# Patient Record
Sex: Female | Born: 1955 | Race: White | Hispanic: No | Smoking: Current every day smoker
Health system: Southern US, Community
[De-identification: ages and names within clinical notes are randomized; demographics above are authoritative.]

## PROBLEM LIST (undated history)

## (undated) DIAGNOSIS — I517 Cardiomegaly: Secondary | ICD-10-CM

## (undated) DIAGNOSIS — I10 Essential (primary) hypertension: Secondary | ICD-10-CM

## (undated) DIAGNOSIS — I340 Nonrheumatic mitral (valve) insufficiency: Secondary | ICD-10-CM

## (undated) DIAGNOSIS — E669 Obesity, unspecified: Secondary | ICD-10-CM

## (undated) DIAGNOSIS — F419 Anxiety disorder, unspecified: Secondary | ICD-10-CM

## (undated) DIAGNOSIS — F17200 Nicotine dependence, unspecified, uncomplicated: Secondary | ICD-10-CM

## (undated) DIAGNOSIS — J45909 Unspecified asthma, uncomplicated: Secondary | ICD-10-CM

## (undated) DIAGNOSIS — M545 Low back pain, unspecified: Secondary | ICD-10-CM

## (undated) DIAGNOSIS — K219 Gastro-esophageal reflux disease without esophagitis: Secondary | ICD-10-CM

## (undated) DIAGNOSIS — J449 Chronic obstructive pulmonary disease, unspecified: Secondary | ICD-10-CM

## (undated) HISTORY — PX: TONSILLECTOMY: SUR1361

## (undated) HISTORY — PX: OTHER SURGICAL HISTORY: SHX169

---

## 2011-04-26 ENCOUNTER — Emergency Department: Payer: Self-pay | Admitting: Emergency Medicine

## 2012-05-29 ENCOUNTER — Emergency Department: Payer: Self-pay | Admitting: Emergency Medicine

## 2012-05-29 LAB — URINALYSIS, COMPLETE
Blood: NEGATIVE
Leukocyte Esterase: NEGATIVE
Nitrite: NEGATIVE
Ph: 6 (ref 4.5–8.0)
Protein: NEGATIVE
RBC,UR: 2 /HPF (ref 0–5)
Squamous Epithelial: <1

## 2013-04-15 ENCOUNTER — Emergency Department: Payer: Self-pay | Admitting: Emergency Medicine

## 2013-05-22 ENCOUNTER — Emergency Department: Payer: Self-pay | Admitting: Emergency Medicine

## 2013-05-22 LAB — BASIC METABOLIC PANEL
Anion Gap: 4 — ABNORMAL LOW (ref 7–16)
BUN: 19 mg/dL — ABNORMAL HIGH (ref 7–18)
CO2: 27 mmol/L (ref 21–32)
CREATININE: 1.29 mg/dL (ref 0.60–1.30)
Calcium, Total: 8.8 mg/dL (ref 8.5–10.1)
Chloride: 107 mmol/L (ref 98–107)
EGFR (African American): 54 — ABNORMAL LOW
GFR CALC NON AF AMER: 46 — AB
Glucose: 103 mg/dL — ABNORMAL HIGH (ref 65–99)
Osmolality: 278 (ref 275–301)
POTASSIUM: 4.4 mmol/L (ref 3.5–5.1)
SODIUM: 138 mmol/L (ref 136–145)

## 2013-05-22 LAB — CBC
HCT: 42.6 % (ref 35.0–47.0)
HGB: 13.8 g/dL (ref 12.0–16.0)
MCH: 30.2 pg (ref 26.0–34.0)
MCHC: 32.4 g/dL (ref 32.0–36.0)
MCV: 93 fL (ref 80–100)
PLATELETS: 194 10*3/uL (ref 150–440)
RBC: 4.55 10*6/uL (ref 3.80–5.20)
RDW: 13.5 % (ref 11.5–14.5)
WBC: 8.7 10*3/uL (ref 3.6–11.0)

## 2013-05-22 LAB — TROPONIN I: Troponin-I: <0.02 ng/mL

## 2013-05-23 LAB — TROPONIN I: Troponin-I: <0.02 ng/mL

## 2013-08-03 ENCOUNTER — Emergency Department: Payer: Self-pay | Admitting: Emergency Medicine

## 2013-08-03 LAB — CBC WITH DIFFERENTIAL/PLATELET
Basophil #: 0.1 10*3/uL (ref 0.0–0.1)
Basophil %: 1.1 %
EOS ABS: 0.3 10*3/uL (ref 0.0–0.7)
Eosinophil %: 4.6 %
HCT: 40.5 % (ref 35.0–47.0)
HGB: 13.5 g/dL (ref 12.0–16.0)
LYMPHS PCT: 23.7 %
Lymphocyte #: 1.5 10*3/uL (ref 1.0–3.6)
MCH: 31.6 pg (ref 26.0–34.0)
MCHC: 33.5 g/dL (ref 32.0–36.0)
MCV: 94 fL (ref 80–100)
Monocyte #: 0.5 x10 3/mm (ref 0.2–0.9)
Monocyte %: 7.8 %
Neutrophil #: 3.9 10*3/uL (ref 1.4–6.5)
Neutrophil %: 62.8 %
Platelet: 199 10*3/uL (ref 150–440)
RBC: 4.29 10*6/uL (ref 3.80–5.20)
RDW: 13.7 % (ref 11.5–14.5)
WBC: 6.2 10*3/uL (ref 3.6–11.0)

## 2013-08-03 LAB — COMPREHENSIVE METABOLIC PANEL
ANION GAP: 5 — AB (ref 7–16)
AST: 35 U/L (ref 15–37)
Albumin: 3.4 g/dL (ref 3.4–5.0)
Alkaline Phosphatase: 94 U/L
BUN: 25 mg/dL — ABNORMAL HIGH (ref 7–18)
Bilirubin,Total: 0.2 mg/dL (ref 0.2–1.0)
CALCIUM: 9.6 mg/dL (ref 8.5–10.1)
CO2: 32 mmol/L (ref 21–32)
CREATININE: 1.2 mg/dL (ref 0.60–1.30)
Chloride: 100 mmol/L (ref 98–107)
EGFR (African American): 59 — ABNORMAL LOW
GFR CALC NON AF AMER: 50 — AB
GLUCOSE: 118 mg/dL — AB (ref 65–99)
Osmolality: 279 (ref 275–301)
POTASSIUM: 3.7 mmol/L (ref 3.5–5.1)
SGPT (ALT): 34 U/L (ref 12–78)
Sodium: 137 mmol/L (ref 136–145)
Total Protein: 7.4 g/dL (ref 6.4–8.2)

## 2013-08-03 LAB — LIPASE, BLOOD: Lipase: 144 U/L (ref 73–393)

## 2013-10-15 ENCOUNTER — Emergency Department: Payer: Self-pay | Admitting: Emergency Medicine

## 2013-10-15 LAB — URINALYSIS, COMPLETE
BILIRUBIN, UR: NEGATIVE
BLOOD: NEGATIVE
Bacteria: NONE SEEN
Glucose,UR: NEGATIVE mg/dL (ref 0–75)
KETONE: NEGATIVE
Leukocyte Esterase: NEGATIVE
NITRITE: NEGATIVE
PH: 6 (ref 4.5–8.0)
Protein: NEGATIVE
RBC,UR: NONE SEEN /HPF (ref 0–5)
SPECIFIC GRAVITY: 1.015 (ref 1.003–1.030)
Squamous Epithelial: 1
WBC UR: NONE SEEN /HPF (ref 0–5)

## 2013-10-15 LAB — BASIC METABOLIC PANEL
ANION GAP: 7 (ref 7–16)
BUN: 10 mg/dL (ref 7–18)
CALCIUM: 9.3 mg/dL (ref 8.5–10.1)
CHLORIDE: 107 mmol/L (ref 98–107)
CREATININE: 1.23 mg/dL (ref 0.60–1.30)
Co2: 28 mmol/L (ref 21–32)
EGFR (African American): 57 — ABNORMAL LOW
EGFR (Non-African Amer.): 49 — ABNORMAL LOW
Glucose: 119 mg/dL — ABNORMAL HIGH (ref 65–99)
OSMOLALITY: 283 (ref 275–301)
POTASSIUM: 3.4 mmol/L — AB (ref 3.5–5.1)
Sodium: 142 mmol/L (ref 136–145)

## 2013-10-15 LAB — DRUG SCREEN, URINE
Amphetamines, Ur Screen: NEGATIVE (ref ?–1000)
Barbiturates, Ur Screen: NEGATIVE (ref ?–200)
Benzodiazepine, Ur Scrn: POSITIVE (ref ?–200)
Cannabinoid 50 Ng, Ur ~~LOC~~: NEGATIVE (ref ?–50)
Cocaine Metabolite,Ur ~~LOC~~: NEGATIVE (ref ?–300)
MDMA (Ecstasy)Ur Screen: NEGATIVE (ref ?–500)
Methadone, Ur Screen: NEGATIVE (ref ?–300)
Opiate, Ur Screen: POSITIVE (ref ?–300)
Phencyclidine (PCP) Ur S: NEGATIVE (ref ?–25)
Tricyclic, Ur Screen: NEGATIVE (ref ?–1000)

## 2013-10-15 LAB — TROPONIN I: Troponin-I: <0.02 ng/mL

## 2013-10-15 LAB — CBC
HCT: 45.6 % (ref 35.0–47.0)
HGB: 15 g/dL (ref 12.0–16.0)
MCH: 31.3 pg (ref 26.0–34.0)
MCHC: 32.9 g/dL (ref 32.0–36.0)
MCV: 95 fL (ref 80–100)
Platelet: 226 10*3/uL (ref 150–440)
RBC: 4.8 10*6/uL (ref 3.80–5.20)
RDW: 13.4 % (ref 11.5–14.5)
WBC: 7.4 10*3/uL (ref 3.6–11.0)

## 2013-10-15 LAB — ETHANOL
Ethanol %: <0.003 % (ref 0.000–0.080)
Ethanol: <3 mg/dL

## 2013-10-15 LAB — SALICYLATE LEVEL: SALICYLATES, SERUM: 3.7 mg/dL — AB

## 2013-10-15 LAB — D-DIMER(ARMC): D-Dimer: 484 ng/ml

## 2013-10-15 LAB — TSH: Thyroid Stimulating Horm: 1.72 u[IU]/mL

## 2013-10-15 LAB — ACETAMINOPHEN LEVEL: Acetaminophen: <2 ug/mL

## 2013-10-31 ENCOUNTER — Emergency Department: Payer: Self-pay | Admitting: Emergency Medicine

## 2013-10-31 LAB — PRO B NATRIURETIC PEPTIDE: B-Type Natriuretic Peptide: 283 pg/mL — ABNORMAL HIGH

## 2013-10-31 LAB — CBC
HCT: 43.1 % (ref 35.0–47.0)
HGB: 13.8 g/dL (ref 12.0–16.0)
MCH: 31 pg (ref 26.0–34.0)
MCHC: 32.1 g/dL (ref 32.0–36.0)
MCV: 97 fL (ref 80–100)
PLATELETS: 201 10*3/uL (ref 150–440)
RBC: 4.46 10*6/uL (ref 3.80–5.20)
RDW: 13.7 % (ref 11.5–14.5)
WBC: 9.9 10*3/uL (ref 3.6–11.0)

## 2013-10-31 LAB — BASIC METABOLIC PANEL
ANION GAP: 10 (ref 7–16)
BUN: 17 mg/dL (ref 7–18)
CHLORIDE: 108 mmol/L — AB (ref 98–107)
CREATININE: 1.4 mg/dL — AB (ref 0.60–1.30)
Calcium, Total: 8.4 mg/dL — ABNORMAL LOW (ref 8.5–10.1)
Co2: 26 mmol/L (ref 21–32)
EGFR (African American): 49 — ABNORMAL LOW
EGFR (Non-African Amer.): 42 — ABNORMAL LOW
Glucose: 86 mg/dL (ref 65–99)
OSMOLALITY: 288 (ref 275–301)
Potassium: 3.3 mmol/L — ABNORMAL LOW (ref 3.5–5.1)
Sodium: 144 mmol/L (ref 136–145)

## 2013-10-31 LAB — TROPONIN I

## 2013-10-31 LAB — D-DIMER(ARMC): D-Dimer: 466 ng/ml

## 2014-07-10 NOTE — Consult Note (Signed)
Brief Consult Note: Diagnosis: Adjustment disorder with disturbance of mood.   Patient was seen by consultant.   Consult note dictated.   Discussed with Attending MD.   Comments: Psychiatry: Patient seen and chart reviewed. Patient denies any suicidal ideation at all now and has no history of suicidal behavior. Was upset because of pain. Suggest DC IVC and release home with follow up with PCP.  Electronic Signatures: Gonzella Lex (MD)  (Signed 31-Jul-15 15:43)  Authored: Brief Consult Note   Last Updated: 31-Jul-15 15:43 by Gonzella Lex (MD)

## 2014-07-10 NOTE — Consult Note (Signed)
PATIENT NAME:  Kimberly Fisher, Kimberly Fisher MR#:  [id number] DATE OF BIRTH:  1956-02-13  DATE OF CONSULTATION:  10/16/2013  REFERRING PHYSICIAN:   CONSULTING PHYSICIAN:  Gonzella Lex, MD  IDENTIFYING INFORMATION AND REASON FOR CONSULT: A 59 year old woman came into the hospital with multiple complaints, specifically though about her back and hip pain. She made statements of suicidality after being evaluated by the Emergency Room physician and was placed on involuntary commitment.   HISTORY OF PRESENT ILLNESS: The patient states that she was upset because she lost her Medicaid and, therefore, ran out of her medicine. She was not able to afford to stay on any of her medication. Her chronic pain problems are getting worse and worse. She is feeling sick and upset. She is angry that she did not feel that her pain was being addressed appropriately and she wanted to get more pain medication. She says that she was just upset when she said that she would kill herself, that she had no actual intention of harming herself. Says that her mood stays frustrated, but denies hopelessness or helplessness. Denies psychotic symptoms. Denies any suicidal thinking.   PAST PSYCHIATRIC HISTORY: Sounds like she has probably been treated for anxiety and nerves in the past, but denies any psychiatric hospitalization. Denies any past history of suicidality and knows of no specific psychiatric diagnosis.   SOCIAL HISTORY: The patient is originally from [place], but has been living here in [place] for a few years. Lives with her children and their family. Seems to think it is a relatively supportive environment.   PAST MEDICAL HISTORY: Most obviously and urgently she has chronic hip age. Also has COPD, gastric reflux disease, dyslipidemia, and high blood pressure.   FAMILY HISTORY: No family history of mental illness.   SUBSTANCE ABUSE HISTORY: Denies alcohol or drug abuse, and denies any history of substance abuse in the past.    REVIEW OF SYSTEMS: Complains of hip and back pain, difficulty ambulating. He gets tired and run down. Denies suicidal or homicidal ideation. Denies hallucinations. Denies helplessness.   MENTAL STATUS EXAMINATION: Disheveled woman, looks her stated age, cooperative with the interview. Eye contact good. Psychomotor activity normal. Speech normal rate, tone and volume. Affect is slightly grumpy, but reactive. Mood is stated as being okay. Thoughts are lucid with no loosening of associations or delusions. Denies auditory or visual hallucinations. Denies suicidal or homicidal ideation. Insight and judgment adequate. Short and long-term memory grossly intact. Alert and oriented x 4.   LABORATORY RESULTS: Drug screen positive for opiates and benzodiazepines. TSH normal. Alcohol level normal.   ASSESSMENT: This is a 59 year old woman with chronic pain and several other medical problems, who is upset that she was not being given a prescription for more pain medicine and made suicidal statements. She has subsequently denied any suicidal ideation. No history of suicidal behavior.  No other acute symptoms of depression. The patient does not meet involuntary commitment criteria and does not need inpatient psychiatric treatment.   TREATMENT PLAN: Discontinue IVC. Recommend that she talk with her primary care doctor about her overall medical management including her mood and anxiety. Supportive and educational counseling done.   DIAGNOSIS, PRINCIPAL AND PRIMARY:  AXIS I: Adjustment disorder with disturbance of mood.   SECONDARY DIAGNOSES:  AXIS I: No further.  AXIS II: No diagnosis.  AXIS III: Chronic pain, high blood pressure, and COPD.  AXIS IV: Severe from lack of funds and being out of her medicine.  AXIS V: Functioning at time  of evaluation 56.    ____________________________ Gonzella Lex, MD jtc:ts D: 10/16/2013 15:47:49 ET T: 10/16/2013 16:16:21 ET JOB#: [id number]  cc: Gonzella Lex, MD,  <Dictator> Gonzella Lex MD ELECTRONICALLY SIGNED 10/28/2013 0:40

## 2015-06-22 ENCOUNTER — Other Ambulatory Visit: Payer: Self-pay | Admitting: Gastroenterology

## 2015-06-22 DIAGNOSIS — R1013 Epigastric pain: Secondary | ICD-10-CM

## 2015-06-22 DIAGNOSIS — R197 Diarrhea, unspecified: Secondary | ICD-10-CM

## 2015-06-28 ENCOUNTER — Ambulatory Visit
Admission: RE | Admit: 2015-06-28 | Discharge: 2015-06-28 | Disposition: A | Discharge status: Home / Self Care | Payer: Medicare PPO | Source: Ambulatory Visit | Attending: Gastroenterology | Admitting: Gastroenterology

## 2015-06-28 DIAGNOSIS — I2510 Atherosclerotic heart disease of native coronary artery without angina pectoris: Secondary | ICD-10-CM | POA: Diagnosis not present

## 2015-06-28 DIAGNOSIS — K449 Diaphragmatic hernia without obstruction or gangrene: Secondary | ICD-10-CM | POA: Insufficient documentation

## 2015-06-28 DIAGNOSIS — R1013 Epigastric pain: Secondary | ICD-10-CM | POA: Insufficient documentation

## 2015-06-28 DIAGNOSIS — E279 Disorder of adrenal gland, unspecified: Secondary | ICD-10-CM | POA: Insufficient documentation

## 2015-06-28 DIAGNOSIS — R197 Diarrhea, unspecified: Secondary | ICD-10-CM | POA: Diagnosis present

## 2015-06-28 DIAGNOSIS — K439 Ventral hernia without obstruction or gangrene: Secondary | ICD-10-CM | POA: Insufficient documentation

## 2015-06-28 DIAGNOSIS — K760 Fatty (change of) liver, not elsewhere classified: Secondary | ICD-10-CM | POA: Diagnosis not present

## 2015-06-28 DIAGNOSIS — Q453 Other congenital malformations of pancreas and pancreatic duct: Secondary | ICD-10-CM | POA: Diagnosis not present

## 2015-06-28 HISTORY — DX: Essential (primary) hypertension: I10

## 2015-06-28 HISTORY — DX: Unspecified asthma, uncomplicated: J45.909

## 2015-06-28 MED ORDER — IOPAMIDOL (ISOVUE-300) INJECTION 61%
100.0000 mL | Freq: Once | INTRAVENOUS | Status: AC | PRN
  Administered 2015-06-28: 100 mL via INTRAVENOUS

## 2015-07-15 ENCOUNTER — Encounter: Payer: Self-pay | Admitting: *Deleted

## 2015-07-18 ENCOUNTER — Ambulatory Visit
Admission: RE | Admit: 2015-07-18 | Discharge: 2015-07-18 | Disposition: A | Discharge status: Home / Self Care | Payer: Medicare PPO | Source: Ambulatory Visit | Attending: Gastroenterology | Admitting: Gastroenterology

## 2015-07-18 ENCOUNTER — Ambulatory Visit: Payer: Medicare PPO | Admitting: Anesthesiology

## 2015-07-18 ENCOUNTER — Encounter
Admission: RE | Disposition: A | Discharge status: Home / Self Care | Payer: Self-pay | Source: Ambulatory Visit | Attending: Gastroenterology

## 2015-07-18 DIAGNOSIS — K529 Noninfective gastroenteritis and colitis, unspecified: Secondary | ICD-10-CM | POA: Diagnosis not present

## 2015-07-18 DIAGNOSIS — E669 Obesity, unspecified: Secondary | ICD-10-CM | POA: Diagnosis not present

## 2015-07-18 DIAGNOSIS — J45909 Unspecified asthma, uncomplicated: Secondary | ICD-10-CM | POA: Insufficient documentation

## 2015-07-18 DIAGNOSIS — K644 Residual hemorrhoidal skin tags: Secondary | ICD-10-CM | POA: Diagnosis not present

## 2015-07-18 DIAGNOSIS — D122 Benign neoplasm of ascending colon: Secondary | ICD-10-CM | POA: Insufficient documentation

## 2015-07-18 DIAGNOSIS — Z7951 Long term (current) use of inhaled steroids: Secondary | ICD-10-CM | POA: Diagnosis not present

## 2015-07-18 DIAGNOSIS — R1013 Epigastric pain: Secondary | ICD-10-CM | POA: Diagnosis present

## 2015-07-18 DIAGNOSIS — Z79899 Other long term (current) drug therapy: Secondary | ICD-10-CM | POA: Insufficient documentation

## 2015-07-18 DIAGNOSIS — K219 Gastro-esophageal reflux disease without esophagitis: Secondary | ICD-10-CM | POA: Insufficient documentation

## 2015-07-18 DIAGNOSIS — K2950 Unspecified chronic gastritis without bleeding: Secondary | ICD-10-CM | POA: Diagnosis not present

## 2015-07-18 DIAGNOSIS — K449 Diaphragmatic hernia without obstruction or gangrene: Secondary | ICD-10-CM | POA: Diagnosis not present

## 2015-07-18 DIAGNOSIS — J449 Chronic obstructive pulmonary disease, unspecified: Secondary | ICD-10-CM | POA: Insufficient documentation

## 2015-07-18 DIAGNOSIS — I10 Essential (primary) hypertension: Secondary | ICD-10-CM | POA: Insufficient documentation

## 2015-07-18 DIAGNOSIS — F419 Anxiety disorder, unspecified: Secondary | ICD-10-CM | POA: Diagnosis not present

## 2015-07-18 DIAGNOSIS — D120 Benign neoplasm of cecum: Secondary | ICD-10-CM | POA: Diagnosis not present

## 2015-07-18 DIAGNOSIS — Z6830 Body mass index (BMI) 30.0-30.9, adult: Secondary | ICD-10-CM | POA: Insufficient documentation

## 2015-07-18 DIAGNOSIS — F17200 Nicotine dependence, unspecified, uncomplicated: Secondary | ICD-10-CM | POA: Insufficient documentation

## 2015-07-18 HISTORY — DX: Nicotine dependence, unspecified, uncomplicated: F17.200

## 2015-07-18 HISTORY — DX: Chronic obstructive pulmonary disease, unspecified: J44.9

## 2015-07-18 HISTORY — DX: Obesity, unspecified: E66.9

## 2015-07-18 HISTORY — PX: ESOPHAGOGASTRODUODENOSCOPY (EGD) WITH PROPOFOL: SHX5813

## 2015-07-18 HISTORY — DX: Low back pain: M54.5

## 2015-07-18 HISTORY — PX: COLONOSCOPY WITH PROPOFOL: SHX5780

## 2015-07-18 HISTORY — DX: Cardiomegaly: I51.7

## 2015-07-18 HISTORY — DX: Low back pain, unspecified: M54.50

## 2015-07-18 HISTORY — DX: Nonrheumatic mitral (valve) insufficiency: I34.0

## 2015-07-18 HISTORY — DX: Anxiety disorder, unspecified: F41.9

## 2015-07-18 HISTORY — DX: Gastro-esophageal reflux disease without esophagitis: K21.9

## 2015-07-18 SURGERY — COLONOSCOPY WITH PROPOFOL
Anesthesia: General

## 2015-07-18 MED ORDER — KETAMINE HCL 50 MG/ML IJ SOLN
INTRAMUSCULAR | Status: DC | PRN
  Administered 2015-07-18: 20 mg via INTRAMUSCULAR

## 2015-07-18 MED ORDER — PROPOFOL 10 MG/ML IV BOLUS
INTRAVENOUS | Status: DC | PRN
  Administered 2015-07-18: 80 mg via INTRAVENOUS

## 2015-07-18 MED ORDER — LIDOCAINE HCL (PF) 2 % IJ SOLN
INTRAMUSCULAR | Status: DC | PRN
  Administered 2015-07-18: 40 mg via INTRADERMAL

## 2015-07-18 MED ORDER — SODIUM CHLORIDE 0.9 % IV SOLN
INTRAVENOUS | Status: DC
Start: 2015-07-18 — End: 2015-07-18
  Administered 2015-07-18: 1000 mL via INTRAVENOUS

## 2015-07-18 MED ORDER — PROPOFOL 500 MG/50ML IV EMUL
INTRAVENOUS | Status: DC | PRN
  Administered 2015-07-18: 150 ug/kg/min via INTRAVENOUS

## 2015-07-18 MED ORDER — LIDOCAINE HCL (PF) 1 % IJ SOLN
2.0000 mL | Freq: Once | INTRAMUSCULAR | Status: AC
  Administered 2015-07-18: 0.03 mL via INTRADERMAL

## 2015-07-18 MED ORDER — LIDOCAINE HCL (PF) 1 % IJ SOLN
INTRAMUSCULAR | Status: AC
  Administered 2015-07-18: 0.03 mL via INTRADERMAL
  Filled 2015-07-18: qty 2

## 2015-07-18 NOTE — Discharge Instructions (Signed)

## 2015-07-18 NOTE — Op Note (Signed)
[facility] Patient Name: Kimberly Fisher Procedure Date: 07/18/2015 3:14 PM MRN: [id number] Account #: [id number] Date of Birth: 1955/04/14 Admit Type: Outpatient Age: 60 Room: [facility] ENDO ROOM 4 Gender: Female Note Status: Finalized Procedure:            Upper GI endoscopy Indications:          Epigastric abdominal pain, Heartburn, Suspected                        esophageal reflux Patient Profile:      This is a 60 year old female. Providers:            Gerrit Heck. Rayann Heman, MD Referring MD:         Jordan Likes. Lavena Bullion (Referring MD) Medicines:            Propofol per Anesthesia Complications:        No immediate complications. Estimated blood loss:                        Minimal. Procedure:            Pre-Anesthesia Assessment:                       - Prior to the procedure, a History and Physical was                        performed, and patient medications, allergies and                        sensitivities were reviewed. The patient's tolerance of                        previous anesthesia was reviewed.                       After obtaining informed consent, the endoscope was                        passed under direct vision. Throughout the procedure,                        the patient's blood pressure, pulse, and oxygen                        saturations were monitored continuously. The Endoscope                        was introduced through the mouth, and advanced to the                        second part of duodenum. The upper GI endoscopy was                        accomplished without difficulty. The patient tolerated                        the procedure well. Findings:      A medium-sized hiatal hernia was present.      Localized mild inflammation characterized by erythema was found in the       gastric antrum. Biopsies were taken with a  cold forceps for histology.      The examined duodenum was normal. Impression:           - Medium-sized  hiatal hernia.                       - Gastritis. Biopsied.                       - Normal examined duodenum. Recommendation:       - Perform a colonoscopy today.                       - Continue present medications.                       - Await pathology results.                       - Avoid NSAIDS                       - Consider alternative PPI to Protonix                       - MRI of panceas given mild PD dilation.                       - The findings and recommendations were discussed with                        the patient.                       - The findings and recommendations were discussed with                        the patient's family. Procedure Code(s):    --- Professional ---                       [id number], Esophagogastroduodenoscopy, flexible, transoral;                        with biopsy, single or multiple Diagnosis Code(s):    --- Professional ---                       K44.9, Diaphragmatic hernia without obstruction or                        gangrene                       K29.70, Gastritis, unspecified, without bleeding                       R10.13, Epigastric pain                       R12, Heartburn CPT copyright 2016 American Medical Association. All rights reserved. The codes documented in this report are preliminary and upon coder review may  be revised to meet current compliance requirements. Mellody Life, MD 07/18/2015 3:23:36 PM This report has been signed electronically. Number of Addenda: 0 Note Initiated On: 07/18/2015 3:14 PM      [facility]

## 2015-07-18 NOTE — Transfer of Care (Signed)
Immediate Anesthesia Transfer of Care Note  Patient: Kimberly Fisher  Procedure(s) Performed: Procedure(s): COLONOSCOPY WITH PROPOFOL (N/A) ESOPHAGOGASTRODUODENOSCOPY (EGD) WITH PROPOFOL (N/A)  Patient Location: PACU and Endoscopy Unit  Anesthesia Type:General  Level of Consciousness: lethargic and cooperative  Airway & Oxygen Therapy: Patient Spontanous Breathing  Post-op Assessment: Report given to RN and Post -op Vital signs reviewed and stable  Post vital signs: Reviewed and stable  Last Vitals:  Filed Vitals:   07/18/15 1321 07/18/15 1557  BP: 139/92 114/75  Pulse: 99 76  Temp: 36.8 C 37 C  Resp: 17 18    Last Pain: There were no vitals filed for this visit.       Complications: No apparent anesthesia complications

## 2015-07-18 NOTE — Anesthesia Preprocedure Evaluation (Signed)
Anesthesia Evaluation  Patient identified by MRN, date of birth, ID band Patient awake    Reviewed: Allergy & Precautions, H&P , NPO status , Patient's Chart, lab work & pertinent test results, reviewed documented beta blocker date and time   Airway Mallampati: II   Neck ROM: full    Dental  (+) Edentulous Upper, Edentulous Lower   Pulmonary neg pulmonary ROS, asthma , COPD,  COPD inhaler, Current Smoker,    Pulmonary exam normal        Cardiovascular Exercise Tolerance: Poor hypertension, negative cardio ROS Normal cardiovascular exam Rate:Normal     Neuro/Psych negative neurological ROS  negative psych ROS   GI/Hepatic negative GI ROS, Neg liver ROS, GERD  Medicated,  Endo/Other  negative endocrine ROS  Renal/GU negative Renal ROS  negative genitourinary   Musculoskeletal   Abdominal   Peds  Hematology negative hematology ROS (+)   Anesthesia Other Findings Past Medical History:   Asthma                                                       Hypertension                                                 Anxiety                                                      Cardiomegaly                                                 COPD (chronic obstructive pulmonary disease) (*              GERD (gastroesophageal reflux disease)                       Nicotine dependence                                          Low back pain                                                Obesity                                                      Mitral valve insufficiency                                 Past Surgical History:  TONSILLECTOMY                                                 CESAREAN SECTION                                              Essential Tubal Ligation                                    BMI    Body Mass Index   30.27 kg/m 2     Reproductive/Obstetrics                              Anesthesia Physical Anesthesia Plan  ASA: III  Anesthesia Plan: General   Post-op Pain Management:    Induction:   Airway Management Planned:   Additional Equipment:   Intra-op Plan:   Post-operative Plan:   Informed Consent: I have reviewed the patients History and Physical, chart, labs and discussed the procedure including the risks, benefits and alternatives for the proposed anesthesia with the patient or authorized representative who has indicated his/her understanding and acceptance.   Dental Advisory Given  Plan Discussed with: CRNA  Anesthesia Plan Comments:         Anesthesia Quick Evaluation

## 2015-07-18 NOTE — H&P (Signed)
Primary Care Physician:  Pcp Not In System  Pre-Procedure History & Physical: HPI:  Kimberly Fisher is a 60 y.o. female is here for an endoscopy / colonoscopy   Past Medical History  Diagnosis Date  . Asthma   . Hypertension   . Anxiety   . Cardiomegaly   . COPD (chronic obstructive pulmonary disease) ([place])   . GERD (gastroesophageal reflux disease)   . Nicotine dependence   . Low back pain   . Obesity   . Mitral valve insufficiency     Past Surgical History  Procedure Laterality Date  . Tonsillectomy    . Cesarean section    . Essential tubal ligation      Prior to Admission medications   Medication Sig Start Date End Date Taking? Authorizing Provider  albuterol (PROVENTIL HFA;VENTOLIN HFA) 108 (90 Base) MCG/ACT inhaler Inhale into the lungs daily as needed for wheezing or shortness of breath.   Yes Historical Provider, MD  budesonide-formoterol (SYMBICORT) 160-4.5 MCG/ACT inhaler Inhale 2 puffs into the lungs 4 (four) times daily.   Yes Historical Provider, MD  diazepam (VALIUM) 5 MG tablet Take 5 mg by mouth 2 (two) times daily.   Yes Historical Provider, MD  diphenoxylate-atropine (LOMOTIL) 2.5-0.025 MG tablet Take by mouth 4 (four) times daily as needed for diarrhea or loose stools.   Yes Historical Provider, MD  gabapentin (NEURONTIN) 100 MG capsule Take 100 mg by mouth 2 (two) times daily.   Yes Historical Provider, MD  lisinopril-hydrochlorothiazide (PRINZIDE,ZESTORETIC) 20-12.5 MG tablet Take 1 tablet by mouth daily.   Yes Historical Provider, MD  loratadine (CLARITIN) 10 MG tablet Take 10 mg by mouth daily.   Yes Historical Provider, MD  pantoprazole (PROTONIX) 40 MG tablet Take 40 mg by mouth daily.   Yes Historical Provider, MD  ranitidine (ZANTAC) 150 MG capsule Take 150 mg by mouth daily as needed for heartburn.   Yes Historical Provider, MD  traMADol (ULTRAM) 50 MG tablet Take by mouth every 6 (six) hours as needed.   Yes Historical Provider, MD    Allergies as  of 07/11/2015 - never reviewed  Allergen Reaction Noted  . Sulfa antibiotics Nausea And Vomiting 06/28/2015    No family history on file.  Social History   Social History  . Marital Status: Widowed    Spouse Name: N/A  . Number of Children: N/A  . Years of Education: N/A   Occupational History  . Not on file.   Social History Main Topics  . Smoking status: Not on file  . Smokeless tobacco: Not on file  . Alcohol Use: Not on file  . Drug Use: Not on file  . Sexual Activity: Not on file   Other Topics Concern  . Not on file   Social History Narrative     Physical Exam: BP 139/92 mmHg  Pulse 99  Temp(Src) 98.2 F (36.8 C) (Tympanic)  Resp 17  Ht 5' (1.524 m)  Wt 70.308 kg (155 lb)  BMI 30.27 kg/m2  SpO2 100% General:   Alert,  pleasant and cooperative in NAD Head:  Normocephalic and atraumatic. Neck:  Supple; no masses or thyromegaly. Lungs:  Clear throughout to auscultation.    Heart:  Regular rate and rhythm. Abdomen:  Soft, nontender and nondistended. Normal bowel sounds, without guarding, and without rebound.   Neurologic:  Alert and  oriented x4;  grossly normal neurologically.  Impression/Plan: Kimberly Fisher is here for an endoscopy to be performed for epigastric pain, GERD,  Colon  for diarrhea  Risks, benefits, limitations, and alternatives regarding  Endoscopy/colonoscopy have been reviewed with the patient.  Questions have been answered.  All parties agreeable.   Josefine Class, MD  07/18/2015, 3:04 PM

## 2015-07-18 NOTE — Op Note (Signed)
[facility] Patient Name: Kimberly Fisher Procedure Date: 07/18/2015 3:23 PM MRN: [id number] Account #: [id number] Date of Birth: 1955/10/25 Admit Type: Outpatient Age: 60 Room: [facility] ENDO ROOM 4 Gender: Female Note Status: Finalized Procedure:            Colonoscopy Indications:          This is the patient's first colonoscopy, Chronic                        diarrhea Patient Profile:      This is a 60 year old female. Providers:            Gerrit Heck. Rayann Heman, MD Referring MD:         Jordan Likes. Lavena Bullion (Referring MD) Medicines:            Propofol per Anesthesia Complications:        No immediate complications. Estimated blood loss:                        Minimal. Procedure:            Pre-Anesthesia Assessment:                       - Prior to the procedure, a History and Physical was                        performed, and patient medications, allergies and                        sensitivities were reviewed. The patient's tolerance of                        previous anesthesia was reviewed.                       After obtaining informed consent, the colonoscope was                        passed under direct vision. Throughout the procedure,                        the patient's blood pressure, pulse, and oxygen                        saturations were monitored continuously. The                        Colonoscope was introduced through the anus and                        advanced to the the cecum, identified by appendiceal                        orifice and ileocecal valve. The colonoscopy was                        performed without difficulty. The patient tolerated the                        procedure well. The quality of the bowel preparation  was good. Findings:      The perianal exam findings include non-thrombosed external hemorrhoids.      A 5 mm polyp was found in the cecum. The polyp was sessile. The polyp       was  removed with a cold snare. Resection and retrieval were complete.      A 4 mm polyp was found in the ascending colon. The polyp was sessile.       The polyp was removed with a cold snare. Resection and retrieval were       complete.      A 3 mm polyp was found in the ascending colon. The polyp was sessile.       The polyp was removed with a jumbo cold forceps. Resection and retrieval       were complete.      Biopsies for histology were taken with a cold forceps from the right       colon, left colon and rectum for evaluation of microscopic colitis. Impression:           - Non-thrombosed external hemorrhoids found on perianal                        exam.                       - One 5 mm polyp in the cecum, removed with a cold                        snare. Resected and retrieved.                       - One 4 mm polyp in the ascending colon, removed with a                        cold snare. Resected and retrieved.                       - One 3 mm polyp in the ascending colon, removed with a                        jumbo cold forceps. Resected and retrieved.                       - Biopsies were taken with a cold forceps from the                        right colon, left colon and rectum for evaluation of                        microscopic colitis.                       - Unable to intubate terminal ileum due to valve                        orientation. Recommendation:       - Observe patient in GI recovery unit.                       - Resume regular diet.                       -  Continue present medications.                       - Await pathology results.                       - Repeat colonoscopy for surveillance based on                        pathology results.                       - Return to referring physician.                       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 Try low fodmap diet, probiotic,                        imodium. Consider xifaxin.                       - The findings and  recommendations were discussed with                        the patient.                       - The findings and recommendations were discussed with                        the patient's family. Procedure Code(s):    --- Professional ---                       [phone], Colonoscopy, flexible; with removal of tumor(s),                        polyp(s), or other lesion(s) by snare technique                       45380, 10, Colonoscopy, flexible; with biopsy, single                        or multiple Diagnosis Code(s):    --- Professional ---                       D12.0, Benign neoplasm of cecum                       D12.2, Benign neoplasm of ascending colon                       K64.4, Residual hemorrhoidal skin tags                       K52.9, Noninfective gastroenteritis and colitis,                        unspecified CPT copyright 2016 American Medical Association. All rights reserved. The codes documented in this report are preliminary and upon coder review may  be revised to meet current compliance requirements. Mellody Life, MD 07/18/2015 3:53:55 PM This report has been signed electronically. Number of Addenda: 0 Note Initiated On: 07/18/2015 3:23 PM Scope Withdrawal Time: 0 hours  17 minutes 56 seconds  Total Procedure Duration: 0 hours 21 minutes 31 seconds       [facility]

## 2015-07-19 ENCOUNTER — Encounter: Payer: Self-pay | Admitting: Gastroenterology

## 2015-07-19 NOTE — Anesthesia Postprocedure Evaluation (Signed)
Anesthesia Post Note  Patient: Kimberly Fisher  Procedure(s) Performed: Procedure(s) (LRB): COLONOSCOPY WITH PROPOFOL (N/A) ESOPHAGOGASTRODUODENOSCOPY (EGD) WITH PROPOFOL (N/A)  Patient location during evaluation: Endoscopy Anesthesia Type: General Level of consciousness: awake and alert Pain management: pain level controlled Vital Signs Assessment: post-procedure vital signs reviewed and stable Respiratory status: spontaneous breathing, nonlabored ventilation, respiratory function stable and patient connected to nasal cannula oxygen Cardiovascular status: blood pressure returned to baseline and stable Postop Assessment: no signs of nausea or vomiting Anesthetic complications: no    Last Vitals:  Filed Vitals:   07/18/15 1615 07/18/15 1625  BP: 145/81 134/89  Pulse: 85 86  Temp:    Resp: 20 17    Last Pain:  Filed Vitals:   07/19/15 0749  PainSc: 0-No pain                 Martha Clan

## 2015-07-20 LAB — SURGICAL PATHOLOGY

## 2015-07-26 ENCOUNTER — Other Ambulatory Visit (HOSPITAL_COMMUNITY): Payer: Self-pay | Admitting: Unknown Physician Specialty

## 2015-07-26 DIAGNOSIS — K8689 Other specified diseases of pancreas: Secondary | ICD-10-CM

## 2015-08-16 ENCOUNTER — Ambulatory Visit: Payer: Medicare PPO

## 2016-01-02 ENCOUNTER — Encounter: Payer: Self-pay | Admitting: Emergency Medicine

## 2016-01-02 ENCOUNTER — Emergency Department
Admission: EM | Admit: 2016-01-02 | Discharge: 2016-01-02 | Disposition: A | Discharge status: Home / Self Care | Payer: Medicare PPO | Attending: Emergency Medicine | Admitting: Emergency Medicine

## 2016-01-02 DIAGNOSIS — H00015 Hordeolum externum left lower eyelid: Secondary | ICD-10-CM | POA: Insufficient documentation

## 2016-01-02 DIAGNOSIS — H578 Other specified disorders of eye and adnexa: Secondary | ICD-10-CM | POA: Diagnosis present

## 2016-01-02 DIAGNOSIS — Z791 Long term (current) use of non-steroidal anti-inflammatories (NSAID): Secondary | ICD-10-CM | POA: Diagnosis not present

## 2016-01-02 DIAGNOSIS — Z760 Encounter for issue of repeat prescription: Secondary | ICD-10-CM

## 2016-01-02 DIAGNOSIS — I10 Essential (primary) hypertension: Secondary | ICD-10-CM | POA: Insufficient documentation

## 2016-01-02 DIAGNOSIS — J449 Chronic obstructive pulmonary disease, unspecified: Secondary | ICD-10-CM | POA: Insufficient documentation

## 2016-01-02 DIAGNOSIS — Z79899 Other long term (current) drug therapy: Secondary | ICD-10-CM | POA: Diagnosis not present

## 2016-01-02 DIAGNOSIS — J301 Allergic rhinitis due to pollen: Secondary | ICD-10-CM | POA: Diagnosis not present

## 2016-01-02 DIAGNOSIS — F17200 Nicotine dependence, unspecified, uncomplicated: Secondary | ICD-10-CM | POA: Diagnosis not present

## 2016-01-02 MED ORDER — GENTAMICIN SULFATE 0.3 % OP SOLN: 1.0000 [drp] | OPHTHALMIC | 0 refills | Status: DC

## 2016-01-02 MED ORDER — FEXOFENADINE-PSEUDOEPHED ER 60-120 MG PO TB12: 1.0000 | ORAL_TABLET | Freq: Two times a day (BID) | ORAL | 0 refills | Status: DC

## 2016-01-02 MED ORDER — HYDROXYZINE HCL 50 MG PO TABS: 50.0000 mg | ORAL_TABLET | Freq: Three times a day (TID) | ORAL | 0 refills | Status: DC | PRN

## 2016-01-02 MED ORDER — LISINOPRIL-HYDROCHLOROTHIAZIDE 10-12.5 MG PO TABS: 1.0000 | ORAL_TABLET | Freq: Every day | ORAL | 1 refills | Status: DC

## 2016-01-02 NOTE — ED Provider Notes (Signed)
[facility] Emergency Department Provider Note   ____________________________________________   First MD Initiated Contact with Patient 01/02/16 [phone]     (approximate)  I have reviewed the triage vital signs and the nursing notes.   HISTORY  Chief Complaint Eye Drainage    HPI Kimberly Fisher is a 60 y.o. female patient complain of eye drainage for 3 days. Patient stated this swelling on the lower eyelid. Patient also has sinus and allergy symptoms. Patient states she has a history of styes and apply warm compresses to the area. Patient states the pain as a 5/10. Patient described a pain as "achy". Patient also states she is out of her blood pressure medication but has not made an appointment with family doctor for refills.   Past Medical History:  Diagnosis Date  . Anxiety   . Asthma   . Cardiomegaly   . COPD (chronic obstructive pulmonary disease) ([place])   . GERD (gastroesophageal reflux disease)   . Hypertension   . Low back pain   . Mitral valve insufficiency   . Nicotine dependence   . Obesity     There are no active problems to display for this patient.   Past Surgical History:  Procedure Laterality Date  . CESAREAN SECTION    . COLONOSCOPY WITH PROPOFOL N/A 07/18/2015   Procedure: COLONOSCOPY WITH PROPOFOL;  Surgeon: Josefine Class, MD;  Location: [facility] ENDOSCOPY;  Service: Endoscopy;  Laterality: N/A;  . ESOPHAGOGASTRODUODENOSCOPY (EGD) WITH PROPOFOL N/A 07/18/2015   Procedure: ESOPHAGOGASTRODUODENOSCOPY (EGD) WITH PROPOFOL;  Surgeon: Josefine Class, MD;  Location: [facility] ENDOSCOPY;  Service: Endoscopy;  Laterality: N/A;  . Essential Tubal Ligation    . TONSILLECTOMY      Prior to Admission medications   Medication Sig Start Date End Date Taking? Authorizing Provider  albuterol (PROVENTIL HFA;VENTOLIN HFA) 108 (90 Base) MCG/ACT inhaler Inhale into the lungs daily as needed for wheezing or shortness of breath.    Historical  Provider, MD  budesonide-formoterol (SYMBICORT) 160-4.5 MCG/ACT inhaler Inhale 2 puffs into the lungs 4 (four) times daily.    Historical Provider, MD  diazepam (VALIUM) 5 MG tablet Take 5 mg by mouth 2 (two) times daily.    Historical Provider, MD  diphenoxylate-atropine (LOMOTIL) 2.5-0.025 MG tablet Take by mouth 4 (four) times daily as needed for diarrhea or loose stools.    Historical Provider, MD  fexofenadine-pseudoephedrine (ALLEGRA-D) 60-120 MG 12 hr tablet Take 1 tablet by mouth 2 (two) times daily. 01/02/16   Sable Feil, PA-C  gabapentin (NEURONTIN) 100 MG capsule Take 100 mg by mouth 2 (two) times daily.    Historical Provider, MD  gentamicin (GARAMYCIN) 0.3 % ophthalmic solution Place 1 drop into both eyes every 4 (four) hours. 01/02/16   Sable Feil, PA-C  hydrOXYzine (ATARAX/VISTARIL) 50 MG tablet Take 1 tablet (50 mg total) by mouth 3 (three) times daily as needed for itching. 01/02/16   Sable Feil, PA-C  lisinopril-hydrochlorothiazide (PRINZIDE,ZESTORETIC) 20-12.5 MG tablet Take 1 tablet by mouth daily.    Historical Provider, MD  lisinopril-hydrochlorothiazide (ZESTORETIC) 10-12.5 MG tablet Take 1 tablet by mouth daily. 01/02/16 01/01/17  Sable Feil, PA-C  loratadine (CLARITIN) 10 MG tablet Take 10 mg by mouth daily.    Historical Provider, MD  pantoprazole (PROTONIX) 40 MG tablet Take 40 mg by mouth daily.    Historical Provider, MD  ranitidine (ZANTAC) 150 MG capsule Take 150 mg by mouth daily as needed for heartburn.    Historical  Provider, MD  traMADol (ULTRAM) 50 MG tablet Take by mouth every 6 (six) hours as needed.    Historical Provider, MD    Allergies Sulfa antibiotics  History reviewed. No pertinent family history.  Social History Social History  Substance Use Topics  . Smoking status: Current Every Day Smoker    Packs/day: 1.00  . Smokeless tobacco: Never Used  . Alcohol use No    Review of Systems Constitutional: No fever/chills Eyes: No  visual changes. Edema and drainage from a 5. ENT: No sore throat. Nasal congestion. Cardiovascular: Denies chest pain. Respiratory: Denies shortness of breath. Gastrointestinal: No abdominal pain.  No nausea, no vomiting.  No diarrhea.  No constipation. Genitourinary: Negative for dysuria. Musculoskeletal: Negative for back pain. Skin: Negative for rash. Neurological: Negative for headaches, focal weakness or numbness. Psychiatric:Anxiety Endocrine:Hypertension Allergic/Immunilogical: Sulfa antibiotics _________________________________________   PHYSICAL EXAM:  VITAL SIGNS: ED Triage Vitals  Enc Vitals Group     BP 01/02/16 0833 (!) 156/93     Pulse Rate 01/02/16 0833 78     Resp 01/02/16 0833 18     Temp 01/02/16 0833 98.2 F (36.8 C)     Temp Source 01/02/16 0833 Oral     SpO2 01/02/16 0833 95 %     Weight 01/02/16 0831 150 lb (68 kg)     Height 01/02/16 0831 5' (1.524 m)     Head Circumference --      Peak Flow --      Pain Score 01/02/16 0831 5     Pain Loc --      Pain Edu? --      Excl. in [place]? --     Constitutional: Alert and oriented. Well appearing and in no acute distress. Eyes: Left erythematous conjunctiva.Marland Kitchen PERRL. EOMI.Papular lesion left lower eyelid. Purulent drainage left eye. Head: Atraumatic. Nose: Bilateral edematous nasal turbinates.  Mouth/Throat: Mucous membranes are moist.  Oropharynx non-erythematous. Neck: No stridor.  No cervical spine tenderness to palpation. Hematological/Lymphatic/Immunilogical: No cervical lymphadenopathy. Cardiovascular: Normal rate, regular rhythm. Grossly normal heart sounds.  Good peripheral circulation. Elevated blood pressure. Respiratory: Normal respiratory effort.  No retractions. Lungs CTAB. Gastrointestinal: Soft and nontender. No distention. No abdominal bruits. No CVA tenderness. Musculoskeletal: No lower extremity tenderness nor edema.  No joint effusions. Neurologic:  Normal speech and language. No gross  focal neurologic deficits are appreciated. No gait instability. Skin:  Skin is warm, dry and intact. No rash noted. Psychiatric: Mood and affect are normal. Speech and behavior are normal.  ____________________________________________   LABS (all labs ordered are listed, but only abnormal results are displayed)  Labs Reviewed - No data to display ____________________________________________  EKG   ____________________________________________  RADIOLOGY   ____________________________________________   PROCEDURES  Procedure(s) performed: None  Procedures  Critical Care performed: No  ____________________________________________   INITIAL IMPRESSION / ASSESSMENT AND PLAN / ED COURSE  Pertinent labs & imaging results that were available during my care of the patient were reviewed by me and considered in my medical decision making (see chart for details).  Rupture sty left lower eyelid. Allergic rhinitis. Patient given discharge care instructions. Patient get a prescription for gentamicin, Atarax, and lisinopril/hydrochlorothiazide. Patient advised follow-up family doctor for continued care.  Clinical Course     ____________________________________________   FINAL CLINICAL IMPRESSION(S) / ED DIAGNOSES  Final diagnoses:  Hordeolum externum of left lower eyelid  Chronic allergic rhinitis due to pollen, unspecified seasonality  Medication refill      NEW MEDICATIONS STARTED DURING  THIS VISIT:  New Prescriptions   FEXOFENADINE-PSEUDOEPHEDRINE (ALLEGRA-D) 60-120 MG 12 HR TABLET    Take 1 tablet by mouth 2 (two) times daily.   GENTAMICIN (GARAMYCIN) 0.3 % OPHTHALMIC SOLUTION    Place 1 drop into both eyes every 4 (four) hours.   HYDROXYZINE (ATARAX/VISTARIL) 50 MG TABLET    Take 1 tablet (50 mg total) by mouth 3 (three) times daily as needed for itching.   LISINOPRIL-HYDROCHLOROTHIAZIDE (ZESTORETIC) 10-12.5 MG TABLET    Take 1 tablet by mouth daily.     Note:   This document was prepared using Dragon voice recognition software and may include unintentional dictation errors.    Sable Feil, PA-C 01/02/16 [id number]    Carrie Mew, MD 01/02/16 [phone]

## 2016-01-02 NOTE — ED Triage Notes (Signed)
Pt presents with eye drainage x 3 days. States she has had some sinus and allergy symptoms as well. Pt has been applying warm compresses. States pain 5/10. NAD noted.

## 2016-01-02 NOTE — ED Notes (Signed)
Left eye red and irritated for couple of days  Denies any trauma

## 2017-08-24 ENCOUNTER — Encounter: Payer: Self-pay | Admitting: Emergency Medicine

## 2017-08-24 ENCOUNTER — Other Ambulatory Visit: Payer: Self-pay

## 2017-08-24 ENCOUNTER — Emergency Department
Admission: EM | Admit: 2017-08-24 | Discharge: 2017-08-24 | Disposition: A | Discharge status: Home / Self Care | Payer: Medicare PPO | Attending: Emergency Medicine | Admitting: Emergency Medicine

## 2017-08-24 ENCOUNTER — Emergency Department: Payer: Medicare PPO

## 2017-08-24 DIAGNOSIS — Y929 Unspecified place or not applicable: Secondary | ICD-10-CM | POA: Diagnosis not present

## 2017-08-24 DIAGNOSIS — Z79899 Other long term (current) drug therapy: Secondary | ICD-10-CM | POA: Insufficient documentation

## 2017-08-24 DIAGNOSIS — F17200 Nicotine dependence, unspecified, uncomplicated: Secondary | ICD-10-CM | POA: Insufficient documentation

## 2017-08-24 DIAGNOSIS — I10 Essential (primary) hypertension: Secondary | ICD-10-CM | POA: Diagnosis not present

## 2017-08-24 DIAGNOSIS — J45909 Unspecified asthma, uncomplicated: Secondary | ICD-10-CM | POA: Insufficient documentation

## 2017-08-24 DIAGNOSIS — Z9114 Patient's other noncompliance with medication regimen: Secondary | ICD-10-CM | POA: Insufficient documentation

## 2017-08-24 DIAGNOSIS — Y939 Activity, unspecified: Secondary | ICD-10-CM | POA: Diagnosis not present

## 2017-08-24 DIAGNOSIS — Y999 Unspecified external cause status: Secondary | ICD-10-CM | POA: Diagnosis not present

## 2017-08-24 DIAGNOSIS — S92532A Displaced fracture of distal phalanx of left lesser toe(s), initial encounter for closed fracture: Secondary | ICD-10-CM | POA: Diagnosis not present

## 2017-08-24 DIAGNOSIS — S99922A Unspecified injury of left foot, initial encounter: Secondary | ICD-10-CM | POA: Diagnosis present

## 2017-08-24 DIAGNOSIS — W501XXA Accidental kick by another person, initial encounter: Secondary | ICD-10-CM | POA: Insufficient documentation

## 2017-08-24 MED ORDER — IBUPROFEN 600 MG PO TABS
600.0000 mg | ORAL_TABLET | Freq: Once | ORAL | Status: AC
Start: 2017-08-24 — End: 2017-08-24
  Administered 2017-08-24: 600 mg via ORAL
  Filled 2017-08-24: qty 1

## 2017-08-24 MED ORDER — CLONIDINE HCL 0.1 MG PO TABS
0.2000 mg | ORAL_TABLET | Freq: Once | ORAL | Status: AC
  Administered 2017-08-24: 0.2 mg via ORAL
  Filled 2017-08-24: qty 2

## 2017-08-24 MED ORDER — LISINOPRIL-HYDROCHLOROTHIAZIDE 10-12.5 MG PO TABS: 1.0000 | ORAL_TABLET | Freq: Every day | ORAL | 0 refills | Status: DC

## 2017-08-24 MED ORDER — TRAMADOL HCL 50 MG PO TABS: 50.0000 mg | ORAL_TABLET | Freq: Two times a day (BID) | ORAL | 0 refills | Status: DC | PRN

## 2017-08-24 MED ORDER — TRAMADOL HCL 50 MG PO TABS
50.0000 mg | ORAL_TABLET | Freq: Once | ORAL | Status: AC
  Administered 2017-08-24: 50 mg via ORAL
  Filled 2017-08-24: qty 1

## 2017-08-24 NOTE — Discharge Instructions (Addendum)
Keep toe buddy tape until evaluation by podiatrist. Restart blood pressure medications tomorrow.  Advised to follow-up with a family clinic to establish continued care.
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
## 2017-08-24 NOTE — ED Notes (Signed)
Pt has hx of HTN and off meds for the past year.  New order received from PA for Clonidine.
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
## 2017-08-24 NOTE — ED Notes (Signed)
Pt transported to XR.  

## 2020-10-03 ENCOUNTER — Observation Stay
Admission: EM | Admit: 2020-10-03 | Discharge: 2020-10-04 | Disposition: A | Discharge status: Home / Self Care | Payer: Medicare PPO | Attending: Internal Medicine | Admitting: Internal Medicine

## 2020-10-03 ENCOUNTER — Inpatient Hospital Stay: Payer: Medicare PPO

## 2020-10-03 ENCOUNTER — Other Ambulatory Visit: Payer: Self-pay

## 2020-10-03 ENCOUNTER — Emergency Department: Payer: Medicare PPO

## 2020-10-03 DIAGNOSIS — R0602 Shortness of breath: Secondary | ICD-10-CM

## 2020-10-03 DIAGNOSIS — J45909 Unspecified asthma, uncomplicated: Secondary | ICD-10-CM | POA: Diagnosis not present

## 2020-10-03 DIAGNOSIS — I10 Essential (primary) hypertension: Secondary | ICD-10-CM | POA: Diagnosis not present

## 2020-10-03 DIAGNOSIS — I161 Hypertensive emergency: Secondary | ICD-10-CM | POA: Diagnosis present

## 2020-10-03 DIAGNOSIS — H532 Diplopia: Secondary | ICD-10-CM

## 2020-10-03 DIAGNOSIS — F17210 Nicotine dependence, cigarettes, uncomplicated: Secondary | ICD-10-CM | POA: Insufficient documentation

## 2020-10-03 DIAGNOSIS — G629 Polyneuropathy, unspecified: Secondary | ICD-10-CM

## 2020-10-03 DIAGNOSIS — Z20822 Contact with and (suspected) exposure to covid-19: Secondary | ICD-10-CM | POA: Insufficient documentation

## 2020-10-03 DIAGNOSIS — R000 Tachycardia, unspecified: Secondary | ICD-10-CM | POA: Insufficient documentation

## 2020-10-03 DIAGNOSIS — G459 Transient cerebral ischemic attack, unspecified: Secondary | ICD-10-CM | POA: Diagnosis not present

## 2020-10-03 DIAGNOSIS — J449 Chronic obstructive pulmonary disease, unspecified: Secondary | ICD-10-CM | POA: Diagnosis not present

## 2020-10-03 DIAGNOSIS — I160 Hypertensive urgency: Secondary | ICD-10-CM | POA: Diagnosis not present

## 2020-10-03 DIAGNOSIS — Z79899 Other long term (current) drug therapy: Secondary | ICD-10-CM | POA: Diagnosis not present

## 2020-10-03 DIAGNOSIS — K219 Gastro-esophageal reflux disease without esophagitis: Secondary | ICD-10-CM

## 2020-10-03 DIAGNOSIS — R29898 Other symptoms and signs involving the musculoskeletal system: Secondary | ICD-10-CM

## 2020-10-03 LAB — COMPREHENSIVE METABOLIC PANEL
ALT: 18 U/L (ref 0–44)
AST: 25 U/L (ref 15–41)
Albumin: 3.7 g/dL (ref 3.5–5.0)
Alkaline Phosphatase: 93 U/L (ref 38–126)
Anion gap: 8 (ref 5–15)
BUN: 11 mg/dL (ref 8–23)
CO2: 22 mmol/L (ref 22–32)
Calcium: 8.8 mg/dL — ABNORMAL LOW (ref 8.9–10.3)
Chloride: 107 mmol/L (ref 98–111)
Creatinine, Ser: 1 mg/dL (ref 0.44–1.00)
GFR, Estimated: >60 mL/min (ref >60)
Glucose, Bld: 127 mg/dL — ABNORMAL HIGH (ref 70–99)
Potassium: 3.7 mmol/L (ref 3.5–5.1)
Sodium: 137 mmol/L (ref 135–145)
Total Bilirubin: 0.5 mg/dL (ref 0.3–1.2)
Total Protein: 6.9 g/dL (ref 6.5–8.1)

## 2020-10-03 LAB — APTT: aPTT: 29 seconds (ref 24–36)

## 2020-10-03 LAB — PROTIME-INR
INR: 1 (ref 0.8–1.2)
Prothrombin Time: 12.7 seconds (ref 11.4–15.2)

## 2020-10-03 LAB — DIFFERENTIAL
Abs Immature Granulocytes: 0.04 10*3/uL (ref 0.00–0.07)
Basophils Absolute: 0.1 10*3/uL (ref 0.0–0.1)
Basophils Relative: 1 %
Eosinophils Absolute: 0.2 10*3/uL (ref 0.0–0.5)
Eosinophils Relative: 2 %
Immature Granulocytes: 1 %
Lymphocytes Relative: 23 %
Lymphs Abs: 1.7 10*3/uL (ref 0.7–4.0)
Monocytes Absolute: 0.4 10*3/uL (ref 0.1–1.0)
Monocytes Relative: 5 %
Neutro Abs: 5 10*3/uL (ref 1.7–7.7)
Neutrophils Relative %: 68 %

## 2020-10-03 LAB — URINALYSIS, COMPLETE (UACMP) WITH MICROSCOPIC
Bilirubin Urine: NEGATIVE
Glucose, UA: 50 mg/dL — AB
Ketones, ur: NEGATIVE mg/dL
Leukocytes,Ua: NEGATIVE
Nitrite: NEGATIVE
Protein, ur: NEGATIVE mg/dL
Specific Gravity, Urine: 1.005 (ref 1.005–1.030)
pH: 7 (ref 5.0–8.0)

## 2020-10-03 LAB — CBC
HCT: 49 % — ABNORMAL HIGH (ref 36.0–46.0)
Hemoglobin: 15.4 g/dL — ABNORMAL HIGH (ref 12.0–15.0)
MCH: 30.3 pg (ref 26.0–34.0)
MCHC: 31.4 g/dL (ref 30.0–36.0)
MCV: 96.3 fL (ref 80.0–100.0)
Platelets: 166 10*3/uL (ref 150–400)
RBC: 5.09 MIL/uL (ref 3.87–5.11)
RDW: 12.7 % (ref 11.5–15.5)
WBC: 7.3 10*3/uL (ref 4.0–10.5)
nRBC: 0 % (ref 0.0–0.2)

## 2020-10-03 LAB — CBG MONITORING, ED: Glucose-Capillary: 121 mg/dL — ABNORMAL HIGH (ref 70–99)

## 2020-10-03 LAB — RESP PANEL BY RT-PCR (FLU A&B, COVID) ARPGX2
Influenza A by PCR: NEGATIVE
Influenza B by PCR: NEGATIVE
SARS Coronavirus 2 by RT PCR: NEGATIVE

## 2020-10-03 LAB — GLUCOSE, CAPILLARY: Glucose-Capillary: 152 mg/dL — ABNORMAL HIGH (ref 70–99)

## 2020-10-03 LAB — MRSA NEXT GEN BY PCR, NASAL: MRSA by PCR Next Gen: NOT DETECTED

## 2020-10-03 MED ORDER — TAMSULOSIN HCL 0.4 MG PO CAPS
0.4000 mg | ORAL_CAPSULE | Freq: Once | ORAL | Status: AC
  Administered 2020-10-03: 0.4 mg via ORAL
  Filled 2020-10-03: qty 1

## 2020-10-03 MED ORDER — ACETAMINOPHEN 325 MG PO TABS
650.0000 mg | ORAL_TABLET | ORAL | Status: DC | PRN
  Administered 2020-10-03 – 2020-10-04 (×2): 650 mg via ORAL
  Filled 2020-10-03 (×2): qty 2

## 2020-10-03 MED ORDER — LISINOPRIL 5 MG PO TABS
10.0000 mg | ORAL_TABLET | Freq: Every day | ORAL | Status: DC
  Administered 2020-10-03 – 2020-10-04 (×2): 10 mg via ORAL
  Filled 2020-10-03 (×2): qty 2

## 2020-10-03 MED ORDER — NICARDIPINE HCL IN NACL 20-0.86 MG/200ML-% IV SOLN
3.0000 mg/h | INTRAVENOUS | Status: DC
  Administered 2020-10-03: 5 mg/h via INTRAVENOUS
  Filled 2020-10-03: qty 200

## 2020-10-03 MED ORDER — HYDROCHLOROTHIAZIDE 12.5 MG PO CAPS
12.5000 mg | ORAL_CAPSULE | Freq: Every day | ORAL | Status: DC
  Filled 2020-10-03: qty 1

## 2020-10-03 MED ORDER — PANTOPRAZOLE SODIUM 40 MG PO TBEC
40.0000 mg | DELAYED_RELEASE_TABLET | Freq: Every day | ORAL | Status: DC
  Administered 2020-10-03 – 2020-10-04 (×2): 40 mg via ORAL
  Filled 2020-10-03 (×2): qty 1

## 2020-10-03 MED ORDER — MOMETASONE FURO-FORMOTEROL FUM 200-5 MCG/ACT IN AERO
2.0000 | INHALATION_SPRAY | Freq: Two times a day (BID) | RESPIRATORY_TRACT | Status: DC
  Administered 2020-10-03 – 2020-10-04 (×3): 2 via RESPIRATORY_TRACT
  Filled 2020-10-03: qty 8.8

## 2020-10-03 MED ORDER — DILTIAZEM HCL-DEXTROSE 125-5 MG/125ML-% IV SOLN (PREMIX)
5.0000 mg/h | INTRAVENOUS | Status: DC
Start: 2020-10-03 — End: 2020-10-04
  Administered 2020-10-03: 5 mg/h via INTRAVENOUS
  Filled 2020-10-03: qty 125

## 2020-10-03 MED ORDER — LORATADINE 10 MG PO TABS
10.0000 mg | ORAL_TABLET | Freq: Every day | ORAL | Status: DC
  Administered 2020-10-03 – 2020-10-04 (×2): 10 mg via ORAL
  Filled 2020-10-03 (×2): qty 1

## 2020-10-03 MED ORDER — LISINOPRIL-HYDROCHLOROTHIAZIDE 10-12.5 MG PO TABS: 1.0000 | ORAL_TABLET | Freq: Every day | ORAL | Status: DC

## 2020-10-03 MED ORDER — STROKE: EARLY STAGES OF RECOVERY BOOK: Freq: Once | Status: AC

## 2020-10-03 MED ORDER — ALBUTEROL SULFATE (2.5 MG/3ML) 0.083% IN NEBU: 3.0000 mL | INHALATION_SOLUTION | Freq: Four times a day (QID) | RESPIRATORY_TRACT | Status: DC | PRN

## 2020-10-03 MED ORDER — GABAPENTIN 100 MG PO CAPS: 100.0000 mg | ORAL_CAPSULE | Freq: Two times a day (BID) | ORAL | Status: DC

## 2020-10-03 MED ORDER — ACETAMINOPHEN 650 MG RE SUPP: 650.0000 mg | RECTAL | Status: DC | PRN

## 2020-10-03 MED ORDER — HYDROXYZINE HCL 25 MG PO TABS: 50.0000 mg | ORAL_TABLET | Freq: Three times a day (TID) | ORAL | Status: DC | PRN

## 2020-10-03 MED ORDER — HYDROCHLOROTHIAZIDE 12.5 MG PO CAPS: 12.5000 mg | ORAL_CAPSULE | Freq: Every day | ORAL | Status: DC

## 2020-10-03 MED ORDER — DIAZEPAM 5 MG PO TABS: 5.0000 mg | ORAL_TABLET | Freq: Two times a day (BID) | ORAL | Status: DC

## 2020-10-03 MED ORDER — ACETAMINOPHEN 160 MG/5ML PO SOLN
650.0000 mg | ORAL | Status: DC | PRN
  Filled 2020-10-03: qty 20.3

## 2020-10-03 MED ORDER — NICOTINE 21 MG/24HR TD PT24
21.0000 mg | MEDICATED_PATCH | Freq: Every day | TRANSDERMAL | Status: DC
  Administered 2020-10-03 – 2020-10-04 (×2): 21 mg via TRANSDERMAL
  Filled 2020-10-03 (×2): qty 1

## 2020-10-03 MED ORDER — DIAZEPAM 5 MG PO TABS
5.0000 mg | ORAL_TABLET | Freq: Two times a day (BID) | ORAL | Status: DC | PRN
  Administered 2020-10-03 – 2020-10-04 (×2): 5 mg via ORAL
  Filled 2020-10-03 (×2): qty 1

## 2020-10-03 MED ORDER — MELATONIN 5 MG PO TABS
5.0000 mg | ORAL_TABLET | Freq: Every evening | ORAL | Status: DC | PRN
  Administered 2020-10-03: 5 mg via ORAL
  Filled 2020-10-03: qty 1

## 2020-10-03 MED ORDER — SODIUM CHLORIDE 0.9% FLUSH: 3.0000 mL | Freq: Once | INTRAVENOUS | Status: DC

## 2020-10-03 NOTE — ED Notes (Signed)
Rico Junker RN aware of assigned bed

## 2020-10-03 NOTE — ED Notes (Signed)
Patient transported to MRI 

## 2020-10-03 NOTE — ED Notes (Signed)
Pt back in room from MRI and X-ray. Monitoring cords in place. Pt primary RN made aware.

## 2020-10-03 NOTE — Progress Notes (Signed)
OT Cancellation Note  Patient Details Name: Kimberly Fisher MRN: [id number] DOB: 1956/01/14   Cancelled Treatment:    Reason Eval/Treat Not Completed: Patient at procedure or test/ unavailable. Order received, chart reviewed. Pt out of room for testing. Will re-attempt OT evaluation at later date/time as pt is available and medically appropriate.  Hanley Hays, MPH, MS, OTR/L ascom [phone] 10/03/20, 2:20 PM

## 2020-10-03 NOTE — ED Triage Notes (Addendum)
Pt comes with c/o right eye double vision and droop of eyelid.  Pt states symptoms started at 3am this morning. Pt has  left arm drift.   Pt also unable to walk which is not normal. Pt appears to lean per family when trying to walk.   Pt states she feels pressure in head.

## 2020-10-03 NOTE — ED Notes (Signed)
Pt transported to MRI and x-ray by this RN

## 2020-10-03 NOTE — H&P (Addendum)
History and Physical   Kimberly Fisher KPT:[id number] DOB: 1955/09/16 DOA: 10/03/2020  PCP: Pcp, No  Outpatient Specialists: none Patient coming from: home   I have personally briefly reviewed patient's old medical records in [place].  Chief Concern: Right eye double vision and right eye droop  HPI: Kimberly Fisher is a 65 y.o. female with medical history significant for anxiety, GERD, hypertension, neuropathy, presents the emergency department for chief concerns of right eye double vision and right eye droop that started approximately 3 AM on day of admission.  She reports that she has not had her blood pressure medications for over 1 year.   She endorses double vision in the right eye that started when she woke up at 3 AM today. She reports that her vision changes has been gradually improving with blood pressure control. She denies ringing in her ear, ear pain.   She denies upper and lower extremities weakness, numbness. She denies chest pain, shortness of breath, abdominal pain, dysuria, speech difficulty.   Last known normal: when she went to bed  Social history: She lives at home with her daughter. She endorses tobacco use 0.5 ppd. 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)  Vaccination history: She is not vaccinated for covid 19  ROS: Constitutional: no weight change, no fever ENT/Mouth: no sore throat, no rhinorrhea Eyes: no eye pain, + vision changes Cardiovascular: no chest pain, no dyspnea,  no edema, no palpitations Respiratory: no cough, no sputum, no wheezing Gastrointestinal: no nausea, no vomiting, no diarrhea, no constipation Genitourinary: no urinary incontinence, no dysuria, no hematuria Musculoskeletal: no arthralgias, no myalgias Skin: no skin lesions, no pruritus, Neuro: no weakness, no loss of consciousness, no syncope Psych: no anxiety, no depression, no decrease appetite Heme/Lymph: no bruising, no bleeding  ED Course: Discussed with EDP,  patient requiring hospitalization for TIA and hypertensive urgency/emergency.  Vitals in the emergency department was remarkable for temperature 98.6, respiration rate of 17, heart rate 99, initial blood pressure 216/138, which has improved to 162/90.  SPO2 of 96% on room air.  Labs in the emergency department was remarkable for sodium 137, potassium 3.7, chloride 107, BUN 11, serum creatinine of 1.0, nonfasting blood glucose 127, WBC 7.8, hemoglobin 15.4, platelets 166, EGFR greater than 60.  CT of the head without contrast was read as age-indeterminate small vessel ischemia in the right lentiform nucleus and possibly the left pons.  No associated hemorrhage or mass-effect.  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  Right middle ear and mastoid opacification.  Consider otitis media.  Acute on chronic appearing paranasal sinus disease.  EDP ordered nicardipine drip set at SBP goal of less than 160, which was started at 1212 Per MAR.  EP also ordered MRI of the brain without contrast.  Assessment/Plan  Principal Problem:   Hypertensive urgency Active Problems:   Hypertensive emergency   TIA (transient ischemic attack)   Essential hypertension   Neuropathy   GERD (gastroesophageal reflux disease)   # Double vision and left arm weakness hypertension- Query stroke versus TIA vs hypertensive urgency # Stroke-like symptoms - CT head without contrast read as above -MRI of the brain without contrast has been ordered, if positive will consult neurology either day of admission or in the a.m. by a.m. team - MRI of the brain was ordered and read as no evidence of acute intracranial abnormality. - Check: fasting lipid, A1c, TSH, B12 - Permissive hypertension pending MRI - Frequent neuro vascular checks -  Per nursing staff, patient passed her swallow screen, heart healthy diet ordered - PT, OT, SLP - Fall precaution   # Primary hypertension-resumed prior prescription of  lisinopril-hydrochlorothiazide 10-12.5 daily - MRI was negative for new acute stroke, therefore I am currently treating for hypertensive urgency - EDP ordered Cardene drip and I change parameters to goal SBP of 190-210 in the first 4 hours and 160-190 after 4 hours -Addendum: Cardene drip has been discontinued -Resumed lisinopril 10 mg daily  # Sinus tachycardia-diltiazem gtt. Initiated after Cardene drip has been discontinued  # History of anxiety-patient formally took diazepam 5 mg twice daily and hydroxyzine as needed - As patient has not been prescribed any of the above medications in the last year, I have resumed diazepam 5 mg every 12 hours as needed for anxiety  # GERD-PPI  # Neuropathy-formally took gabapentin however as patient has not taken it in over a year and did not have any difficulty with neuropathy, I have not resumed the gabapentin  # Patient states she has not been to a primary care doctor or gotten any prescribed medications in over 1 year - She states that it is due to her daughter's busy schedule not being able to take patient to the doctor - Patient will need to reestablish with a PCP on discharge - Extensive counseling with daughter regarding annual visit with PCP to monitor basics including renal, liver, hemoglobin function, etc.  Chart reviewed.   DVT prophylaxis: SCDs Code Status: full code  Diet: Heart healthy Family Communication: Updated daughter, Ms. Susann Givens at [phone] Disposition Plan: Pending clinical course Consults called: None at this time Admission status: Inpatient, stepdown, telemetry  Past Medical History:  Diagnosis Date   Anxiety    Asthma    Cardiomegaly    COPD (chronic obstructive pulmonary disease) ([place])    GERD (gastroesophageal reflux disease)    Hypertension    Low back pain    Mitral valve insufficiency    Nicotine dependence    Obesity    Past Surgical History:  Procedure Laterality Date   CESAREAN SECTION      COLONOSCOPY WITH PROPOFOL N/A 07/18/2015   Procedure: COLONOSCOPY WITH PROPOFOL;  Surgeon: Josefine Class, MD;  Location: [facility] ENDOSCOPY;  Service: Endoscopy;  Laterality: N/A;   ESOPHAGOGASTRODUODENOSCOPY (EGD) WITH PROPOFOL N/A 07/18/2015   Procedure: ESOPHAGOGASTRODUODENOSCOPY (EGD) WITH PROPOFOL;  Surgeon: Josefine Class, MD;  Location: [facility] ENDOSCOPY;  Service: Endoscopy;  Laterality: N/A;   Essential Tubal Ligation     TONSILLECTOMY     Social History:  reports that she has been smoking. She has been smoking an average of 1 pack per day. She has never used smokeless tobacco. She reports that she does not drink alcohol and does not use drugs.  Allergies  Allergen Reactions   Sulfa Antibiotics Nausea And Vomiting   No family history on file. Family history: Family history reviewed and not pertinent  Prior to Admission medications   Medication Sig Start Date End Date Taking? Authorizing Provider  albuterol (PROVENTIL HFA;VENTOLIN HFA) 108 (90 Base) MCG/ACT inhaler Inhale into the lungs daily as needed for wheezing or shortness of breath.    [provider]  budesonide-formoterol (SYMBICORT) 160-4.5 MCG/ACT inhaler Inhale 2 puffs into the lungs 4 (four) times daily.    [provider]  diazepam (VALIUM) 5 MG tablet Take 5 mg by mouth 2 (two) times daily.    [provider]  diphenoxylate-atropine (LOMOTIL) 2.5-0.025 MG tablet Take by mouth 4 (  four) times daily as needed for diarrhea or loose stools.    [provider]  fexofenadine-pseudoephedrine (ALLEGRA-D) 60-120 MG 12 hr tablet Take 1 tablet by mouth 2 (two) times daily. 01/02/16   Sable Feil, PA-C  gabapentin (NEURONTIN) 100 MG capsule Take 100 mg by mouth 2 (two) times daily.    [provider]  gentamicin (GARAMYCIN) 0.3 % ophthalmic solution Place 1 drop into both eyes every 4 (four) hours. 01/02/16   Sable Feil, PA-C  hydrOXYzine (ATARAX/VISTARIL) 50 MG tablet Take 1  tablet (50 mg total) by mouth 3 (three) times daily as needed for itching. 01/02/16   Sable Feil, PA-C  lisinopril-hydrochlorothiazide (PRINZIDE,ZESTORETIC) 20-12.5 MG tablet Take 1 tablet by mouth daily.    [provider]  lisinopril-hydrochlorothiazide (ZESTORETIC) 10-12.5 MG tablet Take 1 tablet by mouth daily. 08/24/17 08/24/18  Sable Feil, PA-C  loratadine (CLARITIN) 10 MG tablet Take 10 mg by mouth daily.    [provider]  pantoprazole (PROTONIX) 40 MG tablet Take 40 mg by mouth daily.    [provider]  ranitidine (ZANTAC) 150 MG capsule Take 150 mg by mouth daily as needed for heartburn.    [provider]  traMADol (ULTRAM) 50 MG tablet Take by mouth every 6 (six) hours as needed.    [provider]  traMADol (ULTRAM) 50 MG tablet Take 1 tablet (50 mg total) by mouth every 12 (twelve) hours as needed. 08/24/17   Sable Feil, PA-C   Physical Exam: Vitals:   10/03/20 1215 10/03/20 1230 10/03/20 1245 10/03/20 1300  BP: (!) 218/117 (!) 204/111 (!) 187/108 (!) 176/105  Pulse: 94 (!) 101 (!) 118 (!) 114  Resp: 20 (!) 22 (!) 21 (!) 21  Temp:      SpO2: 98% 98% 95% 93%  Weight:      Height:       Constitutional: appears older than chronological age, NAD, calm, comfortable Eyes: PERRL, lids and conjunctivae normal ENMT: Mucous membranes are moist. Posterior pharynx clear of any exudate or lesions. Age-appropriate dentition.  Mild hearing loss Neck: normal, supple, no masses, no thyromegaly Respiratory: clear to auscultation bilaterally, no wheezing, no crackles. Normal respiratory effort. No accessory muscle use.  Cardiovascular: Regular rate and rhythm, no murmurs / rubs / gallops. No extremity edema. 2+ pedal pulses. No carotid bruits.  Abdomen: Obese abdomen, no tenderness, no masses palpated, no hepatosplenomegaly. Bowel sounds positive.  Musculoskeletal: no clubbing / cyanosis. No joint deformity upper and lower extremities.  Good ROM, no contractures, no atrophy. Normal muscle tone.  Skin: no rashes, lesions, ulcers. No induration Neurologic: Sensation intact. Strength 5/5 in all 4.  Psychiatric: Normal judgment and insight. Alert and oriented x 3. Normal mood.   EKG: independently reviewed, showing sinus rhythm with rate of 99, QTc 477  Chest x-ray on Admission: I personally reviewed and I agree with radiologist reading as below.  CT HEAD WO CONTRAST  Result Date: 10/03/2020 CLINICAL DATA:  65 year old female with right eye double vision and eyelid droop since 0300 hours. Unable to walk straight. EXAM: CT HEAD WITHOUT CONTRAST TECHNIQUE: Contiguous axial images were obtained from the base of the skull through the vertex without intravenous contrast. COMPARISON:  None. FINDINGS: Brain: Chronic appearing lacunar infarct left basal ganglia. More age indeterminate appearance of right lentiform hypodensity on series 3, image 13. Additionally, subtle evidence of asymmetric hypodensity in the left pons (coronal image 36). No superimposed midline shift, ventriculomegaly, mass effect, evidence of  mass lesion, intracranial hemorrhage or evidence of cortically based acute infarction. No chronic cortical encephalomalacia identified. Vascular: Calcified atherosclerosis at the skull base. No suspicious intracranial vascular hyperdensity. Skull: No acute osseous abnormality identified. Sinuses/Orbits: Partially visible chronic right maxillary sinus mucoperiosteal thickening. Bilateral ethmoid mucosal thickening with some bubbly opacity. Left tympanic cavity and mastoids are well aerated. There is partial opacification of both the right tympanic cavity and the right mastoid air cells. No bone erosion is evident. Other: Probable Disconjugate gaze but otherwise negative visible orbits soft tissues. Visualized scalp soft tissues are within normal limits. IMPRESSION: 1. Age indeterminate small vessel ischemia in the right lentiform nucleus and  possibly also the left pons. No associated hemorrhage or mass effect. 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 2. Right middle ear and mastoid opacification. Consider otitis media. 3. Acute and chronic appearing paranasal sinus disease. Electronically Signed   By: Genevie Ann M.D.   On: 10/03/2020 11:36    Labs on Admission: I have personally reviewed following labs  CBC: Recent Labs  Lab 10/03/20 1046  WBC 7.3  NEUTROABS 5.0  HGB 15.4*  HCT 49.0*  MCV 96.3  PLT 098   Basic Metabolic Panel: Recent Labs  Lab 10/03/20 1046  NA 137  K 3.7  CL 107  CO2 22  GLUCOSE 127*  BUN 11  CREATININE 1.00  CALCIUM 8.8*   GFR: Estimated Creatinine Clearance: 53.3 mL/min (by C-G formula based on SCr of 1 mg/dL).  Liver Function Tests: Recent Labs  Lab 10/03/20 1046  AST 25  ALT 18  ALKPHOS 93  BILITOT 0.5  PROT 6.9  ALBUMIN 3.7   Coagulation Profile: Recent Labs  Lab 10/03/20 1046  INR 1.0   CBG: Recent Labs  Lab 10/03/20 1045  GLUCAP 121*   Urine analysis:    Component Value Date/Time   COLORURINE Yellow 10/15/2013 1331   APPEARANCEUR Clear 10/15/2013 1331   LABSPEC 1.015 10/15/2013 1331   PHURINE 6.0 10/15/2013 1331   GLUCOSEU Negative 10/15/2013 1331   HGBUR Negative 10/15/2013 1331   BILIRUBINUR Negative 10/15/2013 1331   KETONESUR Negative 10/15/2013 1331   PROTEINUR Negative 10/15/2013 1331   NITRITE Negative 10/15/2013 1331   LEUKOCYTESUR Negative 10/15/2013 1331   CRITICAL CARE Performed by: Briant Cedar Elimelech Houseman  Total critical care time: 30 minutes  Critical care time was exclusive of separately billable procedures and treating other patients.  Critical care was necessary to treat or prevent imminent or life-threatening deterioration.Circulatory failure, hypertensive urgency  Critical care was time spent personally by me on the following activities:  development of treatment plan with patient and/or surrogate as well as nursing, discussions  with consultants, evaluation of patient's response to treatment, examination of patient, obtaining history from patient or surrogate, ordering and performing treatments and interventions, ordering and review of laboratory studies, ordering and review of radiographic studies, pulse oximetry and re-evaluation of patient's condition.  Dr. Tobie Poet [facility]  If 7PM-7AM, please contact overnight-coverage provider If 7AM-7PM, please contact day coverage provider [email or web address]  10/03/2020, 1:31 PM

## 2020-10-03 NOTE — Progress Notes (Signed)
[facility] Progress Note  I received nursing note that patient's heart rate has been elevated in the 120s and request for nicotine patch. Per vitals in epic, her heart rate is 131-134.  Systolic blood pressure 485T to 160s.  I discontinued Cardene drip and started patient on diltiazem drip with goal heart rate of 75-105.   Discussed plan and new orders with nursing staff.  Dr. Tobie Poet [facility] hospitalist

## 2020-10-03 NOTE — ED Notes (Signed)
This RN at bedside to transport pt to MRI

## 2020-10-03 NOTE — Progress Notes (Signed)
Patient c/o abdominal discomfort during her abdominal assessment and stated that she feel the urge to urinate often , but can't go. Urine noted in the canister from [place]. Patient blanner scanned with a residual amount of 420 mls noted. Spoke with Dr. Tobie Poet who ordered Flomax for the patient. Patient advised to notify me if she feel no relief.

## 2020-10-03 NOTE — ED Provider Notes (Signed)
[facility] Emergency Department Provider Note   ____________________________________________   Event Date/Time   First MD Initiated Contact with Patient 10/03/20 1330     (approximate)  I have reviewed the triage vital signs and the nursing notes.   HISTORY  Chief Complaint Blurred Vision    HPI Kimberly Fisher is a 65 y.o. female who presents for right eye blurry and double vision as well as left arm weakness  LOCATION: Right eye, left arm DURATION: Began at 3 AM this morning TIMING: Improved since onset SEVERITY: Severe QUALITY: Blurred/double vision, weakness CONTEXT: Patient states that she awoke this morning with the above symptoms, of the left arm has improved in strength but patient still complains of double vision MODIFYING FACTORS: Denies any exacerbating or relieving factors ASSOCIATED SYMPTOMS: Denies any associated symptoms   Per medical record review, patient has had hypertensive emergency and TIA in the past          Past Medical History:  Diagnosis Date   Anxiety    Asthma    Cardiomegaly    COPD (chronic obstructive pulmonary disease) (HCC)    GERD (gastroesophageal reflux disease)    Hypertension    Low back pain    Mitral valve insufficiency    Nicotine dependence    Obesity     Patient Active Problem List   Diagnosis Date Noted   Hypertensive emergency 10/03/2020   TIA (transient ischemic attack) 10/03/2020   Essential hypertension 10/03/2020   Hypertensive urgency 10/03/2020   Neuropathy 10/03/2020   GERD (gastroesophageal reflux disease) 10/03/2020    Past Surgical History:  Procedure Laterality Date   CESAREAN SECTION     COLONOSCOPY WITH PROPOFOL N/A 07/18/2015   Procedure: COLONOSCOPY WITH PROPOFOL;  Surgeon: Josefine Class, MD;  Location: [facility] ENDOSCOPY;  Service: Endoscopy;  Laterality: N/A;   ESOPHAGOGASTRODUODENOSCOPY (EGD) WITH PROPOFOL N/A 07/18/2015   Procedure: ESOPHAGOGASTRODUODENOSCOPY  (EGD) WITH PROPOFOL;  Surgeon: Josefine Class, MD;  Location: [facility] ENDOSCOPY;  Service: Endoscopy;  Laterality: N/A;   Essential Tubal Ligation     TONSILLECTOMY      Prior to Admission medications   Medication Sig Start Date End Date Taking? Authorizing Provider  albuterol (PROVENTIL HFA;VENTOLIN HFA) 108 (90 Base) MCG/ACT inhaler Inhale into the lungs daily as needed for wheezing or shortness of breath.    [provider]  budesonide-formoterol (SYMBICORT) 160-4.5 MCG/ACT inhaler Inhale 2 puffs into the lungs 4 (four) times daily.    [provider]  diazepam (VALIUM) 5 MG tablet Take 5 mg by mouth 2 (two) times daily.    [provider]  diphenoxylate-atropine (LOMOTIL) 2.5-0.025 MG tablet Take by mouth 4 (four) times daily as needed for diarrhea or loose stools.    [provider]  fexofenadine-pseudoephedrine (ALLEGRA-D) 60-120 MG 12 hr tablet Take 1 tablet by mouth 2 (two) times daily. 01/02/16   Sable Feil, PA-C  gabapentin (NEURONTIN) 100 MG capsule Take 100 mg by mouth 2 (two) times daily.    [provider]  gentamicin (GARAMYCIN) 0.3 % ophthalmic solution Place 1 drop into both eyes every 4 (four) hours. 01/02/16   Sable Feil, PA-C  hydrOXYzine (ATARAX/VISTARIL) 50 MG tablet Take 1 tablet (50 mg total) by mouth 3 (three) times daily as needed for itching. 01/02/16   Sable Feil, PA-C  lisinopril-hydrochlorothiazide (PRINZIDE,ZESTORETIC) 20-12.5 MG tablet Take 1 tablet by mouth daily.    [provider]  lisinopril-hydrochlorothiazide (ZESTORETIC) 10-12.5 MG tablet Take 1 tablet  by mouth daily. 08/24/17 08/24/18  Sable Feil, PA-C  loratadine (CLARITIN) 10 MG tablet Take 10 mg by mouth daily.    [provider]  pantoprazole (PROTONIX) 40 MG tablet Take 40 mg by mouth daily.    [provider]  ranitidine (ZANTAC) 150 MG capsule Take 150 mg by mouth daily as needed for heartburn.    [provider]  traMADol (ULTRAM) 50 MG tablet Take by mouth every 6 (six) hours as needed.    [provider]  traMADol (ULTRAM) 50 MG tablet Take 1 tablet (50 mg total) by mouth every 12 (twelve) hours as needed. 08/24/17   Sable Feil, PA-C    Allergies Sulfa antibiotics  No family history on file.  Social History Social History   Tobacco Use   Smoking status: Every Day    Packs/day: 1.00    Types: Cigarettes   Smokeless tobacco: Never  Substance Use Topics   Alcohol use: No   Drug use: No    Review of Systems Constitutional: No fever/chills Eyes: Endorses double vision and blurry vision ENT: No sore throat. Cardiovascular: Denies chest pain. Respiratory: Denies shortness of breath. Gastrointestinal: No abdominal pain.  No nausea, no vomiting.  No diarrhea. Genitourinary: Negative for dysuria. Musculoskeletal: Negative for acute arthralgias Skin: Negative for rash. Neurological: Negative for headaches, endorses transient weakness of the left upper extremity, denies numbness/paresthesias in any extremity Psychiatric: Negative for suicidal ideation/homicidal ideation   ____________________________________________   PHYSICAL EXAM:  VITAL SIGNS: ED Triage Vitals  Enc Vitals Group     BP 10/03/20 1047 (!) 241/125     Pulse Rate 10/03/20 1047 85     Resp 10/03/20 1047 19     Temp 10/03/20 1047 98.6 F (37 C)     Temp src --      SpO2 10/03/20 1047 94 %     Weight 10/03/20 1142 180 lb (81.6 kg)     Height 10/03/20 1142 4\' 11"  (1.499 m)     Head Circumference --      Peak Flow --      Pain Score --      Pain Loc --      Pain Edu? --      Excl. in [place]? --    Constitutional: Alert and oriented. Well appearing and in no acute distress. Eyes: Conjunctivae are normal. PERRL.  Endorses diplopia Head: Atraumatic. Nose: No congestion/rhinnorhea. Mouth/Throat: Mucous membranes are moist. Neck: No stridor Cardiovascular: Grossly normal heart sounds.  Good  peripheral circulation. Respiratory: Normal respiratory effort.  No retractions. Gastrointestinal: Soft and nontender. No distention. Musculoskeletal: No obvious deformities Neurologic:  Normal speech and language. No gross focal neurologic deficits are appreciated. Skin:  Skin is warm and dry. No rash noted. Psychiatric: Mood and affect are normal. Speech and behavior are normal.  ____________________________________________   LABS (all labs ordered are listed, but only abnormal results are displayed)  Labs Reviewed  CBC - Abnormal; Notable for the following components:      Result Value   Hemoglobin 15.4 (*)    HCT 49.0 (*)    All other components within normal limits  COMPREHENSIVE METABOLIC PANEL - Abnormal; Notable for the following components:   Glucose, Bld 127 (*)    Calcium 8.8 (*)    All other components within normal limits  CBG MONITORING, ED - Abnormal; Notable for the following components:   Glucose-Capillary 121 (*)    All other components within normal limits  RESP PANEL BY RT-PCR (FLU A&B, COVID) ARPGX2  PROTIME-INR  APTT  DIFFERENTIAL  HIV ANTIBODY (ROUTINE TESTING W REFLEX)   ____________________________________________  EKG  ED ECG REPORT I, Naaman Plummer, the attending physician, personally viewed and interpreted this ECG.  Date: 10/03/2020 EKG Time: 1104 Rate: 99 Rhythm: Tachycardic sinus rhythm QRS Axis: normal Intervals: normal ST/T Wave abnormalities: normal Narrative Interpretation: no evidence of acute ischemia  ____________________________________________  RADIOLOGY  ED MD interpretation: CT of the head without contrast shows small vessel ischemia in the right lentiform nucleus and likely left pons.  There is also a chronic appearing lacunar infarct left basal ganglia  Official radiology report(s): CT HEAD WO CONTRAST  Result Date: 10/03/2020 CLINICAL DATA:  65 year old female with right eye double vision and eyelid droop since  0300 hours. Unable to walk straight. EXAM: CT HEAD WITHOUT CONTRAST TECHNIQUE: Contiguous axial images were obtained from the base of the skull through the vertex without intravenous contrast. COMPARISON:  None. FINDINGS: Brain: Chronic appearing lacunar infarct left basal ganglia. More age indeterminate appearance of right lentiform hypodensity on series 3, image 13. Additionally, subtle evidence of asymmetric hypodensity in the left pons (coronal image 36). No superimposed midline shift, ventriculomegaly, mass effect, evidence of mass lesion, intracranial hemorrhage or evidence of cortically based acute infarction. No chronic cortical encephalomalacia identified. Vascular: Calcified atherosclerosis at the skull base. No suspicious intracranial vascular hyperdensity. Skull: No acute osseous abnormality identified. Sinuses/Orbits: Partially visible chronic right maxillary sinus mucoperiosteal thickening. Bilateral ethmoid mucosal thickening with some bubbly opacity. Left tympanic cavity and mastoids are well aerated. There is partial opacification of both the right tympanic cavity and the right mastoid air cells. No bone erosion is evident. Other: Probable Disconjugate gaze but otherwise negative visible orbits soft tissues. Visualized scalp soft tissues are within normal limits. IMPRESSION: 1. Age indeterminate small vessel ischemia in the right lentiform nucleus and possibly also the left pons. No associated hemorrhage or mass effect. 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 2. Right middle ear and mastoid opacification. Consider otitis media. 3. Acute and chronic appearing paranasal sinus disease. Electronically Signed   By: Genevie Ann M.D.   On: 10/03/2020 11:36    ____________________________________________   PROCEDURES  Procedure(s) performed (including Critical Care):  .Critical Care  Date/Time: 10/03/2020 2:17 PM Performed by: Naaman Plummer, MD Authorized by: Naaman Plummer,  MD   Critical care provider statement:    Critical care time (minutes):  31   Critical care time was exclusive of:  Separately billable procedures and treating other patients   Critical care was necessary to treat or prevent imminent or life-threatening deterioration of the following conditions:  CNS failure or compromise   Critical care was time spent personally by me on the following activities:  Discussions with consultants, evaluation of patient's response to treatment, examination of patient, ordering and performing treatments and interventions, ordering and review of laboratory studies, ordering and review of radiographic studies, pulse oximetry, re-evaluation of patient's condition, obtaining history from patient or surrogate and review of old charts   I assumed direction of critical care for this patient from another provider in my specialty: no     Care discussed with: admitting provider   .1-3 Lead EKG Interpretation  Date/Time: 10/03/2020 2:17 PM Performed by: Naaman Plummer, MD Authorized by: Naaman Plummer, MD     Interpretation: normal     ECG rate:  97   ECG rate assessment: normal  Rhythm: sinus rhythm     Ectopy: none     Conduction: normal     ____________________________________________   INITIAL IMPRESSION / ASSESSMENT AND PLAN / ED COURSE  As part of my medical decision making, I reviewed the following data within the electronic medical record, if available:  Nursing notes reviewed and incorporated, Labs reviewed, EKG interpreted, Old chart reviewed, Radiograph reviewed and Notes from prior ED visits reviewed and incorporated     Patient is a 65 year old female who presents with symptoms concerning for TIA PMH risk factors: Previous TIA, hypertension Neurologic Deficits: Diplopia, left upper extremity weakness Last known Well Time: 0300 NIH Stroke Score: 1 Given History and Exam I have lower suspicion for infectious etiology, neurologic changes secondary  to toxicologic ingestion, seizure, complex migraine. Presentation concerning for possible stroke requiring workup.  Workup: Labs: POC glucose, CBC, BMP, LFTs, Troponin, PT/INR, PTT, Type and Screen Other Diagnostics: ECG, CXR, non-contrast head CT followed by CTA brain and neck (to r/o large vessel occlusion amenable to thrombectomy) Interventions: Patient's not eligible for TPA due to resolution of symptoms prior to evaluation  Consult: hospitalist Disposition: Admit      ____________________________________________   FINAL CLINICAL IMPRESSION(S) / ED DIAGNOSES  Final diagnoses:  Diplopia  Weakness of left upper extremity  TIA (transient ischemic attack)     ED Discharge Orders     None        Note:  This document was prepared using Dragon voice recognition software and may include unintentional dictation errors.    Naaman Plummer, MD 10/03/20 [phone]

## 2020-10-03 NOTE — Progress Notes (Signed)
PT Cancellation Note  Patient Details Name: Kimberly Fisher MRN: [id number] DOB: Jan 16, 1956   Cancelled Treatment:    Reason Eval/Treat Not Completed: Other (comment): Per Dr. Tobie Poet hold PT evaluation until 10/04/20.  Will attempt to see pt at a future date/time as medically appropriate.     Linus Salmons PT, DPT 10/03/20, 4:09 PM

## 2020-10-04 ENCOUNTER — Encounter: Payer: Self-pay | Admitting: Internal Medicine

## 2020-10-04 ENCOUNTER — Inpatient Hospital Stay: Payer: Medicare PPO

## 2020-10-04 DIAGNOSIS — I160 Hypertensive urgency: Secondary | ICD-10-CM | POA: Diagnosis not present

## 2020-10-04 DIAGNOSIS — H4901 Third [oculomotor] nerve palsy, right eye: Secondary | ICD-10-CM

## 2020-10-04 LAB — BASIC METABOLIC PANEL
Anion gap: 6 (ref 5–15)
BUN: 14 mg/dL (ref 8–23)
CO2: 25 mmol/L (ref 22–32)
Calcium: 9 mg/dL (ref 8.9–10.3)
Chloride: 108 mmol/L (ref 98–111)
Creatinine, Ser: 1.02 mg/dL — ABNORMAL HIGH (ref 0.44–1.00)
GFR, Estimated: >60 mL/min (ref >60)
Glucose, Bld: 134 mg/dL — ABNORMAL HIGH (ref 70–99)
Potassium: 3.9 mmol/L (ref 3.5–5.1)
Sodium: 139 mmol/L (ref 135–145)

## 2020-10-04 LAB — LIPID PANEL
Cholesterol: 217 mg/dL — ABNORMAL HIGH (ref 0–200)
HDL: 43 mg/dL (ref >40)
LDL Cholesterol: 156 mg/dL — ABNORMAL HIGH (ref 0–99)
Total CHOL/HDL Ratio: 5 RATIO
Triglycerides: 88 mg/dL (ref <150)
VLDL: 18 mg/dL (ref 0–40)

## 2020-10-04 LAB — TSH: TSH: 3.605 u[IU]/mL (ref 0.350–4.500)

## 2020-10-04 LAB — HIV ANTIBODY (ROUTINE TESTING W REFLEX): HIV Screen 4th Generation wRfx: NONREACTIVE

## 2020-10-04 LAB — CBC
HCT: 39.8 % (ref 36.0–46.0)
Hemoglobin: 13.3 g/dL (ref 12.0–15.0)
MCH: 30.2 pg (ref 26.0–34.0)
MCHC: 33.4 g/dL (ref 30.0–36.0)
MCV: 90.2 fL (ref 80.0–100.0)
Platelets: 222 10*3/uL (ref 150–400)
RBC: 4.41 MIL/uL (ref 3.87–5.11)
RDW: 12.7 % (ref 11.5–15.5)
WBC: 8.5 10*3/uL (ref 4.0–10.5)
nRBC: 0 % (ref 0.0–0.2)

## 2020-10-04 LAB — HEMOGLOBIN A1C
Hgb A1c MFr Bld: 5.8 % — ABNORMAL HIGH (ref 4.8–5.6)
Mean Plasma Glucose: 119.76 mg/dL

## 2020-10-04 LAB — VITAMIN B12: Vitamin B-12: 208 pg/mL (ref 180–914)

## 2020-10-04 MED ORDER — ALBUTEROL SULFATE HFA 108 (90 BASE) MCG/ACT IN AERS: 1.0000 | INHALATION_SPRAY | RESPIRATORY_TRACT | 0 refills | Status: AC | PRN

## 2020-10-04 MED ORDER — DIAZEPAM 5 MG PO TABS: 5.0000 mg | ORAL_TABLET | Freq: Two times a day (BID) | ORAL | 0 refills | Status: AC | PRN

## 2020-10-04 MED ORDER — TRAMADOL HCL 50 MG PO TABS: 50.0000 mg | ORAL_TABLET | Freq: Four times a day (QID) | ORAL | 0 refills | Status: AC | PRN

## 2020-10-04 MED ORDER — NICOTINE 21 MG/24HR TD PT24: 21.0000 mg | MEDICATED_PATCH | Freq: Every day | TRANSDERMAL | 0 refills | Status: AC

## 2020-10-04 MED ORDER — AMLODIPINE BESYLATE 5 MG PO TABS: 5.0000 mg | ORAL_TABLET | Freq: Every day | ORAL | 0 refills | Status: DC

## 2020-10-04 MED ORDER — LISINOPRIL 10 MG PO TABS: 10.0000 mg | ORAL_TABLET | Freq: Every day | ORAL | 0 refills | Status: DC

## 2020-10-04 MED ORDER — ATORVASTATIN CALCIUM 40 MG PO TABS: 40.0000 mg | ORAL_TABLET | Freq: Every day | ORAL | 0 refills | Status: DC

## 2020-10-04 MED ORDER — AMLODIPINE BESYLATE 5 MG PO TABS
5.0000 mg | ORAL_TABLET | Freq: Every day | ORAL | Status: DC
  Administered 2020-10-04: 5 mg via ORAL
  Filled 2020-10-04: qty 1

## 2020-10-04 MED ORDER — ASPIRIN 81 MG PO TBEC: 81.0000 mg | DELAYED_RELEASE_TABLET | Freq: Every day | ORAL | 11 refills | Status: DC

## 2020-10-04 MED ORDER — TRAMADOL HCL 50 MG PO TABS
50.0000 mg | ORAL_TABLET | Freq: Four times a day (QID) | ORAL | Status: DC | PRN
  Administered 2020-10-04: 50 mg via ORAL
  Filled 2020-10-04: qty 1

## 2020-10-04 MED ORDER — PHENAZOPYRIDINE HCL 100 MG PO TABS
100.0000 mg | ORAL_TABLET | Freq: Three times a day (TID) | ORAL | Status: DC
  Administered 2020-10-04 (×2): 100 mg via ORAL
  Filled 2020-10-04 (×4): qty 1

## 2020-10-04 MED ORDER — CHLORHEXIDINE GLUCONATE CLOTH 2 % EX PADS
6.0000 | MEDICATED_PAD | Freq: Every day | CUTANEOUS | Status: DC
  Administered 2020-10-04: 6 via TOPICAL

## 2020-10-04 MED ORDER — IOHEXOL 350 MG/ML SOLN
75.0000 mL | Freq: Once | INTRAVENOUS | Status: AC | PRN
  Administered 2020-10-04: 75 mL via INTRAVENOUS

## 2020-10-04 MED ORDER — BUDESONIDE-FORMOTEROL FUMARATE 160-4.5 MCG/ACT IN AERO: 2.0000 | INHALATION_SPRAY | Freq: Four times a day (QID) | RESPIRATORY_TRACT | 0 refills | Status: AC

## 2020-10-04 MED ORDER — ASPIRIN EC 81 MG PO TBEC
81.0000 mg | DELAYED_RELEASE_TABLET | Freq: Every day | ORAL | Status: DC
  Administered 2020-10-04: 81 mg via ORAL
  Filled 2020-10-04: qty 1

## 2020-10-04 NOTE — Progress Notes (Signed)
   10/04/20 1100  Clinical Encounter Type  Visited With Patient and family together  Visit Type Initial;Spiritual support;Social support  Referral From Chaplain  Consult/Referral To [employer] met briefly with PT and her daughter who was at bedside. Chaplain gave an invitation to [employer]. Chaplain ministered with compassionate presence and made PT aware that he would check on her again another day, PT was in the process of ordering food.

## 2020-10-04 NOTE — Discharge Summary (Signed)
Physician Discharge Summary  Kimberly Fisher MOQ:[id number] DOB: May 25, 1955 DOA: 10/03/2020  PCP: Merryl Hacker, No  Admit date: 10/03/2020 Discharge date: 10/04/2020  Admitted From: Home Disposition: Home with home health  Recommendations for Outpatient Follow-up:  Follow up with PCP in 1-2 weeks Please establish care with [facility] ophthalmology  Home Health: Yes Equipment/Devices: No  Discharge Condition: Stable CODE STATUS: Full Diet recommendation: Heart healthy  Brief/Interim Summary: 65 y.o. female with medical history significant for anxiety, GERD, hypertension, neuropathy, presents the emergency department for chief concerns of right eye double vision and right eye droop that started approximately 3 AM on day of admission.  She reports that she has not had her blood pressure medications for over 1 year.   She endorses double vision in the right eye that started when she woke up at 3 AM today. She reports that her vision changes has been gradually improving with blood pressure control. She denies ringing in her ear, ear pain.    She denies upper and lower extremities weakness, numbness. She denies chest pain, shortness of breath, abdominal pain, dysuria, speech difficulty.    Last known normal: when she went to bed  Presentation imaging survey was and consistent with acute CVA.  Neurology was consulted due to visual disturbance.  Likely secondary to small vessel microvascular disease.  We did pursue CTA which was negative for large vessel occlusion or aneurysm.  Discussed with patient and daughter at bedside.  Cleared for discharge home.  Will need good blood pressure control, reestablish care with PCP, follow-up with ophthalmology.   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 Possible evaluation Therapy recommending home health, ordered On discharge will recommend aspirin 81, high intensity statin, blood pressure control Patient needs to reestablish care with PCP and seek follow-up with ophthalmology Instructions given for [facility]  Discharge Instructions  Discharge Instructions     Diet - low sodium heart healthy   Complete by: As directed    Increase activity slowly   Complete by: As directed       Allergies as of 10/04/2020       Reactions   Sulfa Antibiotics Nausea And Vomiting        Medication List     STOP taking these medications    gentamicin 0.3 % ophthalmic solution Commonly known as: Garamycin   lisinopril-hydrochlorothiazide 10-12.5 MG tablet Commonly known as: Zestoretic   lisinopril-hydrochlorothiazide 20-12.5 MG tablet Commonly known as: ZESTORETIC       TAKE these medications    albuterol 108 (90 Base) MCG/ACT inhaler Commonly known as: VENTOLIN HFA Inhale 1-2 puffs into the lungs every 4 (four) hours as needed for wheezing or shortness of breath. What changed:  how much to take when to take this   amLODipine 5 MG tablet Commonly known as: NORVASC Take 1 tablet (5 mg total) by mouth daily. Start taking on: October 05, 2020   aspirin 81 MG EC tablet Take 1 tablet (81 mg total) by mouth daily. Swallow whole. Start taking on: October 05, 2020   budesonide-formoterol 160-4.5 MCG/ACT inhaler Commonly known as: SYMBICORT Inhale 2 puffs into the lungs 4 (four) times daily.   diazepam 5 MG tablet Commonly known as: VALIUM Take 1 tablet (5 mg total)  by mouth every 12 (twelve) hours as needed for up to 3 days for anxiety. What changed:  when to take this reasons to take this   lisinopril 10 MG tablet Commonly known as: ZESTRIL Take 1 tablet (10 mg total) by mouth daily. Start taking on: October 05, 2020    nicotine 21 mg/24hr patch Commonly known as: NICODERM CQ - dosed in mg/24 hours Place 1 patch (21 mg total) onto the skin daily. Start taking on: October 05, 2020   traMADol 50 MG tablet Commonly known as: ULTRAM Take 1 tablet (50 mg total) by mouth every 6 (six) hours as needed for up to 3 days. What changed:  how much to take Another medication with the same name was removed. Continue taking this medication, and follow the directions you see here.       ASK your doctor about these medications    diphenoxylate-atropine 2.5-0.025 MG tablet Commonly known as: LOMOTIL Take by mouth 4 (four) times daily as needed for diarrhea or loose stools.   fexofenadine-pseudoephedrine 60-120 MG 12 hr tablet Commonly known as: ALLEGRA-D Take 1 tablet by mouth 2 (two) times daily.   gabapentin 100 MG capsule Commonly known as: NEURONTIN Take 100 mg by mouth 2 (two) times daily.   hydrOXYzine 50 MG tablet Commonly known as: ATARAX/VISTARIL Take 1 tablet (50 mg total) by mouth 3 (three) times daily as needed for itching.   loratadine 10 MG tablet Commonly known as: CLARITIN Take 10 mg by mouth daily.   pantoprazole 40 MG tablet Commonly known as: PROTONIX Take 40 mg by mouth daily.   ranitidine 150 MG capsule Commonly known as: ZANTAC Take 150 mg by mouth daily as needed for heartburn.               Durable Medical Equipment  (From admission, onward)           Start     Ordered   10/04/20 1458  For home use only DME 3 n 1  Once        10/04/20 1457   10/04/20 1341  For home use only DME Walker rolling  Once       Question Answer Comment  Walker: With 5 Inch Wheels   Patient needs a walker to treat with the following condition Weakness      10/04/20 1340            Follow-up Information     Eulogio Bear, MD. Schedule an appointment as soon as possible for a visit in 1 week(s).   Specialty: Ophthalmology Why: Eye specialist.  Please follow up at [facility] for evaluation of blurred vision.  You can see any provider there Contact information: [place] [address] [phone]                Allergies  Allergen Reactions   Sulfa Antibiotics Nausea And Vomiting    Consultations: Neurology   Procedures/Studies: CT ANGIO HEAD W OR WO CONTRAST  Result Date: 10/04/2020 CLINICAL DATA:  Neuro deficit, acute, stroke suspected. Hypertension and double vision. EXAM: CT ANGIOGRAPHY HEAD TECHNIQUE: Multidetector CT imaging of the head was performed using the standard protocol during bolus administration of intravenous contrast. Multiplanar CT image reconstructions and MIPs were obtained to evaluate the vascular anatomy. CONTRAST:  29mL OMNIPAQUE IOHEXOL 350 MG/ML SOLN COMPARISON:  MRI of the brain October 03, 2020 FINDINGS: CT HEAD Brain: No evidence of acute infarction, hemorrhage, hydrocephalus, extra-axial collection or mass  lesion/mass effect. Remote lacunar infarcts within the bilateral basal ganglia region, left thalamus and posterior limb of left internal capsule, as seen on recent MRI. Vascular: No hyperdense vessel or unexpected calcification. Skull: Normal. Negative for fracture or focal lesion. Sinuses: Mucosal thickening of the bilateral maxillary sinuses with increased sinus wall thickness bilaterally and fluid level on the right, suggesting acute on chronic process. Other: Prominent right mastoid effusion and mild on the left. CTA HEAD Anterior circulation: Mild atherosclerotic changes of the bilateral carotid siphons without hemodynamically significant stenosis. Normal contrast opacification of the bilateral ACA and MCA vascular trees noting a hypoplastic right A1/ACA segment. Prominent right and diminutive left posterior communicating arteries without evidence of aneurysm. Posterior circulation: Mild luminal irregularity of the P2 segment of the left PCA resulting in mild stenosis. Mild luminal irregularity of  the right PCA without hemodynamically significant stenosis. The basilar artery and intracranial bilateral vertebral arteries are maintained. No proximal occlusion, aneurysm, or vascular malformation. Venous sinuses: Non-opacified. Anatomic variants: None significant Review of the MIP images confirms the above findings. IMPRESSION: 1. No acute intracranial abnormality. 2. Remote lacunar infarcts within the bilateral basal ganglia region, left thalamus and posterior limb of left internal capsule, as seen on recent MRI. 3. No intracranial large vessel occlusion, hemodynamically significant stenosis, aneurysm or AVM identified. 4. Mild luminal irregularity of the bilateral PCAs, suggestive of intracranial atherosclerotic disease, with mild stenosis of the left P2 segment. 5. Mild atherosclerotic changes of the bilateral carotid siphons without hemodynamically significant stenosis. Electronically Signed   By: Pedro Earls M.D.   On: 10/04/2020 14:38   CT HEAD WO CONTRAST  Result Date: 10/03/2020 CLINICAL DATA:  65 year old female with right eye double vision and eyelid droop since 0300 hours. Unable to walk straight. EXAM: CT HEAD WITHOUT CONTRAST TECHNIQUE: Contiguous axial images were obtained from the base of the skull through the vertex without intravenous contrast. COMPARISON:  None. FINDINGS: Brain: Chronic appearing lacunar infarct left basal ganglia. More age indeterminate appearance of right lentiform hypodensity on series 3, image 13. Additionally, subtle evidence of asymmetric hypodensity in the left pons (coronal image 36). No superimposed midline shift, ventriculomegaly, mass effect, evidence of mass lesion, intracranial hemorrhage or evidence of cortically based acute infarction. No chronic cortical encephalomalacia identified. Vascular: Calcified atherosclerosis at the skull base. No suspicious intracranial vascular hyperdensity. Skull: No acute osseous abnormality identified.  Sinuses/Orbits: Partially visible chronic right maxillary sinus mucoperiosteal thickening. Bilateral ethmoid mucosal thickening with some bubbly opacity. Left tympanic cavity and mastoids are well aerated. There is partial opacification of both the right tympanic cavity and the right mastoid air cells. No bone erosion is evident. Other: Probable Disconjugate gaze but otherwise negative visible orbits soft tissues. Visualized scalp soft tissues are within normal limits. IMPRESSION: 1. Age indeterminate small vessel ischemia in the right lentiform nucleus and possibly also the left pons. No associated hemorrhage or mass effect. 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 2. Right middle ear and mastoid opacification. Consider otitis media. 3. Acute and chronic appearing paranasal sinus disease. Electronically Signed   By: Genevie Ann M.D.   On: 10/03/2020 11:36   MR Brain Wo Contrast (neuro protocol)  Result Date: 10/03/2020 CLINICAL DATA:  Neuro deficit, acute, stroke suspected. Additional history provided: Patient reports right eye double vision and droop of eyelid, patient reports symptoms began at 3 a.m. this morning, left arm drift, patient unable to walk. EXAM: MRI HEAD WITHOUT CONTRAST TECHNIQUE: Multiplanar, multiecho pulse sequences of  the brain and surrounding structures were obtained without intravenous contrast. COMPARISON:  Prior head CT examinations 10/03/2020 and earlier FINDINGS: Brain: Mild generalized cerebral and cerebellar atrophy. Chronic lacunar infarcts within bilateral basal ganglia, posterior limb of left internal capsule, left thalamus and left pons. Chronic hemosiderin deposition associated with a chronic lacunar infarct within the left basal ganglia. Background mild-to-moderate multifocal T2/FLAIR hyperintensity within the cerebral white matter and pons, nonspecific but compatible with chronic small vessel ischemic disease. No evidence of acute infarction. No evidence of an  intracranial mass. No extra-axial fluid collection. No midline shift. Vascular: Expected proximal arterial flow voids. Skull and upper cervical spine: No focal marrow lesion. Sinuses/Orbits: Visualized orbits show no acute finding. Frothy secretions and severe mucosal thickening within the right maxillary sinus. Mild mucosal thickening within the left sphenoid sinus. Frothy secretions and mild mucosal thickening within the right ethmoid sinuses. Mild mucosal thickening within the left ethmoid sinuses. Trace mucosal thickening within the frontal sinuses. Other: Large right middle ear/mastoid effusion. A left mastoid effusion is also present. IMPRESSION: No evidence of acute intracranial abnormality. Chronic lacunar infarcts within the bilateral basal ganglia, posterior limb of left internal capsule, left thalamus and left pons. Background mild-to-moderate chronic small vessel ischemic disease within the cerebral white matter and pons. Mild generalized cerebral atrophy. Paranasal sinus disease, as described. Large right middle ear/mastoid effusion. A left mastoid effusion is also present. Electronically Signed   By: Kellie Simmering DO   On: 10/03/2020 14:34   DG Chest Port 1 View  Result Date: 10/03/2020 CLINICAL DATA:  Onset drooping of the right eyelid and double vision of the right eye at 3 a.m. this morning. EXAM: PORTABLE CHEST 1 VIEW COMPARISON:  PA and lateral chest 10/31/2013. FINDINGS: Lungs clear. Heart size normal. Aortic atherosclerosis. No pneumothorax or pleural fluid. No acute or focal bony abnormality. IMPRESSION: No acute disease. Aortic Atherosclerosis (ICD10-I70.0). Electronically Signed   By: Inge Rise M.D.   On: 10/03/2020 14:46   (Echo, Carotid, EGD, Colonoscopy, ERCP)    Subjective: Seen and examined the time of discharge.  Still with visual disturbance but overall stable.  Discharge Exam: Vitals:   10/04/20 1400 10/04/20 1500  BP: 140/71   Pulse: 99 99  Resp: (!) 23 (!) 24   Temp:    SpO2: 92% 94%   Vitals:   10/04/20 1241 10/04/20 1300 10/04/20 1400 10/04/20 1500  BP:  135/90 140/71   Pulse: (!) 101 (!) 105 99 99  Resp: (!) 29 (!) 25 (!) 23 (!) 24  Temp:      TempSrc:      SpO2: 95% 96% 92% 94%  Weight:      Height:        General: Pt is alert, awake, not in acute distress Cardiovascular: RRR, S1/S2 +, no rubs, no gallops Respiratory: CTA bilaterally, no wheezing, no rhonchi Abdominal: Soft, NT, ND, bowel sounds + Extremities: no edema, no cyanosis    The results of significant diagnostics from this hospitalization (including imaging, microbiology, ancillary and laboratory) are listed below for reference.     Microbiology: Recent Results (from the past 240 hour(s))  Resp Panel by RT-PCR (Flu A&B, Covid) Nasopharyngeal Swab     Status: None   Collection Time: 10/03/20  2:36 PM   Specimen: Nasopharyngeal Swab; Nasopharyngeal(NP) swabs in vial transport medium  Result Value Ref Range Status   SARS Coronavirus 2 by RT PCR NEGATIVE NEGATIVE Final    Comment: (NOTE) SARS-CoV-2 target nucleic acids are NOT  DETECTED.  The SARS-CoV-2 RNA is generally detectable in upper respiratory specimens during the acute phase of infection. The lowest concentration of SARS-CoV-2 viral copies this assay can detect is 138 copies/mL. A negative result does not preclude SARS-Cov-2 infection and should not be used as the sole basis for treatment or other patient management decisions. A negative result may occur with  improper specimen collection/handling, submission of specimen other than nasopharyngeal swab, presence of viral mutation(s) within the areas targeted by this assay, and inadequate number of viral copies(<138 copies/mL). A negative result must be combined with clinical observations, patient history, and epidemiological information. The expected result is Negative.  Fact Sheet for Patients:  EntrepreneurPulse.com.au  Fact Sheet for  Healthcare Providers:  IncredibleEmployment.be  This test is no t yet approved or cleared by the [place] FDA and  has been authorized for detection and/or diagnosis of SARS-CoV-2 by FDA under an Emergency Use Authorization (EUA). This EUA will remain  in effect (meaning this test can be used) for the duration of the COVID-19 declaration under Section 564(b)(1) of the Act, 21 U.S.C.section 360bbb-3(b)(1), unless the authorization is terminated  or revoked sooner.       Influenza A by PCR NEGATIVE NEGATIVE Final   Influenza B by PCR NEGATIVE NEGATIVE Final    Comment: (NOTE) The Xpert Xpress SARS-CoV-2/FLU/RSV plus assay is intended as an aid in the diagnosis of influenza from Nasopharyngeal swab specimens and should not be used as a sole basis for treatment. Nasal washings and aspirates are unacceptable for Xpert Xpress SARS-CoV-2/FLU/RSV testing.  Fact Sheet for Patients: EntrepreneurPulse.com.au  Fact Sheet for Healthcare Providers: IncredibleEmployment.be  This test is not yet approved or cleared by the [place] FDA and has been authorized for detection and/or diagnosis of SARS-CoV-2 by FDA under an Emergency Use Authorization (EUA). This EUA will remain in effect (meaning this test can be used) for the duration of the COVID-19 declaration under Section 564(b)(1) of the Act, 21 U.S.C. section 360bbb-3(b)(1), unless the authorization is terminated or revoked.  Performed at [facility], [address] [place], [place] [address]   MRSA Next Gen by PCR, Nasal     Status: None   Collection Time: 10/03/20  3:23 PM   Specimen: Nasal Mucosa; Nasal Swab  Result Value Ref Range Status   MRSA by PCR Next Gen NOT DETECTED NOT DETECTED Final    Comment: (NOTE) The GeneXpert MRSA Assay (FDA approved for NASAL specimens only), is one component of a comprehensive MRSA colonization surveillance program. It is  not intended to diagnose MRSA infection nor to guide or monitor treatment for MRSA infections. Test performance is not FDA approved in patients less than 36 years old. Performed at [facility], [address] [place], [place] [address]      Labs: BNP (last 3 results) No results for input(s): BNP in the last 8760 hours. Basic Metabolic Panel: Recent Labs  Lab 10/03/20 1046 10/04/20 0421  NA 137 139  K 3.7 3.9  CL 107 108  CO2 22 25  GLUCOSE 127* 134*  BUN 11 14  CREATININE 1.00 1.02*  CALCIUM 8.8* 9.0   Liver Function Tests: Recent Labs  Lab 10/03/20 1046  AST 25  ALT 18  ALKPHOS 93  BILITOT 0.5  PROT 6.9  ALBUMIN 3.7   No results for input(s): LIPASE, AMYLASE in the last 168 hours. No results for input(s): AMMONIA in the last 168 hours. CBC: Recent Labs  Lab 10/03/20 1046 10/04/20 0421  WBC 7.3 8.5  NEUTROABS 5.0  --   HGB 15.4* 13.3  HCT 49.0* 39.8  MCV 96.3 90.2  PLT 166 222   Cardiac Enzymes: No results for input(s): CKTOTAL, CKMB, CKMBINDEX, TROPONINI in the last 168 hours. BNP: Invalid input(s): POCBNP CBG: Recent Labs  Lab 10/03/20 1045 10/03/20 1516  GLUCAP 121* 152*   D-Dimer No results for input(s): DDIMER in the last 72 hours. Hgb A1c Recent Labs    10/04/20 0421  HGBA1C 5.8*   Lipid Profile Recent Labs    10/04/20 0421  CHOL 217*  HDL 43  LDLCALC 156*  TRIG 88  CHOLHDL 5.0   Thyroid function studies Recent Labs    10/04/20 0421  TSH 3.605   Anemia work up Recent Labs    10/04/20 0421  VITAMINB12 208   Urinalysis    Component Value Date/Time   COLORURINE STRAW (A) 10/03/2020 2141   APPEARANCEUR CLEAR (A) 10/03/2020 2141   APPEARANCEUR Clear 10/15/2013 1331   LABSPEC 1.005 10/03/2020 2141   LABSPEC 1.015 10/15/2013 1331   PHURINE 7.0 10/03/2020 2141   GLUCOSEU 50 (A) 10/03/2020 2141   GLUCOSEU Negative 10/15/2013 1331   HGBUR SMALL (A) 10/03/2020 2141   BILIRUBINUR NEGATIVE 10/03/2020 2141    BILIRUBINUR Negative 10/15/2013 1331   KETONESUR NEGATIVE 10/03/2020 2141   PROTEINUR NEGATIVE 10/03/2020 2141   NITRITE NEGATIVE 10/03/2020 2141   LEUKOCYTESUR NEGATIVE 10/03/2020 2141   LEUKOCYTESUR Negative 10/15/2013 1331   Sepsis Labs Invalid input(s): PROCALCITONIN,  WBC,  LACTICIDVEN Microbiology Recent Results (from the past 240 hour(s))  Resp Panel by RT-PCR (Flu A&B, Covid) Nasopharyngeal Swab     Status: None   Collection Time: 10/03/20  2:36 PM   Specimen: Nasopharyngeal Swab; Nasopharyngeal(NP) swabs in vial transport medium  Result Value Ref Range Status   SARS Coronavirus 2 by RT PCR NEGATIVE NEGATIVE Final    Comment: (NOTE) SARS-CoV-2 target nucleic acids are NOT DETECTED.  The SARS-CoV-2 RNA is generally detectable in upper respiratory specimens during the acute phase of infection. The lowest concentration of SARS-CoV-2 viral copies this assay can detect is 138 copies/mL. A negative result does not preclude SARS-Cov-2 infection and should not be used as the sole basis for treatment or other patient management decisions. A negative result may occur with  improper specimen collection/handling, submission of specimen other than nasopharyngeal swab, presence of viral mutation(s) within the areas targeted by this assay, and inadequate number of viral copies(<138 copies/mL). A negative result must be combined with clinical observations, patient history, and epidemiological information. The expected result is Negative.  Fact Sheet for Patients:  EntrepreneurPulse.com.au  Fact Sheet for Healthcare Providers:  IncredibleEmployment.be  This test is no t yet approved or cleared by the [place] FDA and  has been authorized for detection and/or diagnosis of SARS-CoV-2 by FDA under an Emergency Use Authorization (EUA). This EUA will remain  in effect (meaning this test can be used) for the duration of the COVID-19 declaration under  Section 564(b)(1) of the Act, 21 U.S.C.section 360bbb-3(b)(1), unless the authorization is terminated  or revoked sooner.       Influenza A by PCR NEGATIVE NEGATIVE Final   Influenza B by PCR NEGATIVE NEGATIVE Final    Comment: (NOTE) The Xpert Xpress SARS-CoV-2/FLU/RSV plus assay is intended as an aid in the diagnosis of influenza from Nasopharyngeal swab specimens and should not be used as a sole basis for treatment. Nasal washings and aspirates are unacceptable for Xpert Xpress SARS-CoV-2/FLU/RSV testing.  Fact Sheet for Patients: EntrepreneurPulse.com.au  Fact Sheet for Healthcare Providers: IncredibleEmployment.be  This test is not yet approved or cleared by the [place] FDA and has been authorized for detection and/or diagnosis of SARS-CoV-2 by FDA under an Emergency Use Authorization (EUA). This EUA will remain in effect (meaning this test can be used) for the duration of the COVID-19 declaration under Section 564(b)(1) of the Act, 21 U.S.C. section 360bbb-3(b)(1), unless the authorization is terminated or revoked.  Performed at [facility], [address] [place], [place] [address]   MRSA Next Gen by PCR, Nasal     Status: None   Collection Time: 10/03/20  3:23 PM   Specimen: Nasal Mucosa; Nasal Swab  Result Value Ref Range Status   MRSA by PCR Next Gen NOT DETECTED NOT DETECTED Final    Comment: (NOTE) The GeneXpert MRSA Assay (FDA approved for NASAL specimens only), is one component of a comprehensive MRSA colonization surveillance program. It is not intended to diagnose MRSA infection nor to guide or monitor treatment for MRSA infections. Test performance is not FDA approved in patients less than 49 years old. Performed at [facility], [address], [place],  [address]      Time coordinating discharge: Over 30 minutes  SIGNED:   Sidney Ace, MD  [facility] 10/04/2020, 3:03 PM Pager   If 7PM-7AM, please contact night-coverage

## 2020-10-04 NOTE — Care Management Obs Status (Signed)
[place] NOTIFICATION   Patient Details  Name: Kimberly Fisher MRN: [id number] Date of Birth: 1955/05/30   Medicare Observation Status Notification Given:  Yes    Adelene Amas, [place] 10/04/2020, 2:58 PM

## 2020-10-04 NOTE — Consult Note (Signed)
Neurology Consultation Reason for Consult: Diplopia Referring Physician: Michelene Gardener  CC: Diplopia  History is obtained from: Patient  HPI: Kalii Chesmore is a 65 y.o. female who was admitted with hypertensive emergency yesterday and had double vision.  The symptoms started at 3 AM this morning.  She also has been having some unsteadiness, which occurs with mainly with her eyes open.   ROS: A 14 point ROS was performed and is negative except as noted in the HPI.  Past Medical History:  Diagnosis Date   Anxiety    Asthma    Cardiomegaly    COPD (chronic obstructive pulmonary disease) (HCC)    GERD (gastroesophageal reflux disease)    Hypertension    Low back pain    Mitral valve insufficiency    Nicotine dependence    Obesity      Family history: No history of similar   Social History:  reports that she has been smoking. She has been smoking an average of 1 pack per day. She has never used smokeless tobacco. She reports that she does not drink alcohol and does not use drugs.   Exam: Current vital signs: BP (!) 146/91   Pulse (!) 112   Temp 98.4 F (36.9 C) (Oral)   Resp (!) 25   Ht 4\' 11"  (1.499 m)   Wt 78.8 kg   SpO2 94%   BMI 35.09 kg/m  Vital signs in last 24 hours: Temp:  [98.3 F (36.8 C)-98.7 F (37.1 C)] 98.4 F (36.9 C) (07/19 0248) Pulse Rate:  [67-129] 112 (07/19 1000) Resp:  [14-28] 25 (07/19 1000) BP: (116-225)/(59-151) 146/91 (07/19 1000) SpO2:  [93 %-98 %] 94 % (07/19 1000) Weight:  [78.8 kg-81.6 kg] 78.8 kg (07/18 1716)   Physical Exam  Constitutional: Appears well-developed and well-nourished.  Psych: Affect appropriate to situation Eyes: No scleral injection HENT: No OP obstruction MSK: no joint deformities.  Cardiovascular: Normal rate and regular rhythm.  Respiratory: Effort normal, non-labored breathing GI: Soft.  No distension. There is no tenderness.  Skin: WDI  Neuro: Mental Status: Patient is awake, alert, oriented to person,  place, month, year, and situation. Patient is able to give a clear and coherent history. No signs of aphasia or neglect Cranial Nerves: II: Visual Fields are full. Pupils are equal, round, and reactive to light.   III,IV, VI: She has no medial movement of the right eye, with upgaze she has outward deviation and she has impaired downgaze as well. She has preserved abduction. EOMI in the left eye V: Facial sensation is symmetric to temperature VII: Facial movement is symmetric.  VIII: hearing is intact to voice X: Uvula elevates symmetrically XI: Shoulder shrug is symmetric. XII: tongue is midline without atrophy or fasciculations.  Motor: Tone is normal. Bulk is normal. 5/5 strength was present in all four extremities.  Sensory: Sensation is symmetric to light touch and temperature in the arms and legs.Cerebellar: She has tremor on FNF bilaterally, but passpointing only with both eyes open.     I have reviewed labs in epic and the results pertinent to this consultation are: Creatinine 1.0  I have reviewed the images obtained:MRI brain - old strokes, nothing acute.   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 This is almost certainly microvascular in nature, but it would be prudent to get CTA to rule out aneurysm.   Her current presentation is not related to ischemic cerebral stroke, but with a history of strokes,  would still use secondary prevention guidelines including LDL goal < 70.   She has a history of neuropathy, and I suspect that her gait dysfunction is multifactorial including some orthostasis (she describes lightheadedness when standing), neuropathy, and visual disturbance.  She may need some physical therapy to learn to cope with her diplopia safely.  Recommendations: 1) Consider high intensity statin for LDL > 70 2) ASA 81 mg daily for secondary stroke prevention 3) CTA head 4) if CTA is negative for aneurysm, then she can follow up  as an outpatient with ophthamology.    Roland Rack, MD [facility] [phone]

## 2020-10-04 NOTE — Care Management CC44 (Signed)
Condition Code 44 Documentation Completed  Patient Details  Name: Kenitha Glendinning MRN: [id number] Date of Birth: 1956-01-04   Condition Code 44 given:  Yes Patient signature on Condition Code 44 notice:  Yes Documentation of 2 MD's agreement:  Yes Code 44 added to claim:  Yes    Adelene Amas, [place] 10/04/2020, 2:58 PM

## 2020-10-04 NOTE — Evaluation (Signed)
Physical Therapy Evaluation Patient Details Name: Kimberly Fisher MRN: [id number] DOB: 1955-11-29 Today's Date: 10/04/2020   History of Present Illness  Pt is a 65 y.o. female with medical history significant for anxiety, GERD, hypertension, neuropathy, presents the emergency department for chief concerns of right eye double vision and right eye droop.  MD assessment includes: Hypertensive urgency, double vision, stroke-like symptoms, and sinus tachycardia.  Per MD MRI impression included "No evidence of acute intracranial abnormality, chronic lacunar infarcts within the bilateral basal ganglia, posterior limb of left internal capsule, left thalamus and left pons".   Clinical Impression  Pt was pleasant and motivated to participate during the session.  Pt presented with primary c/o "double vision" and spent most of the session with her R eye closed.  Delayed R eye tracking noted compared to the L eye on assessment.  BUE and BLE strength and sensation grossly WFL and equal L/R.  Minor deficits noted to BUE fine and gross motor coordination but equal L/R.  Pt presented with min instability posteriorly upon initial stand requiring min A to correct but required no additional assistance to prevent LOB while in standing.  Pt was able to take several very small steps at the EOB when her HR increased from the 110's to a high of 148 and was returned to sitting with HR gradually decreasing back to the 110's.  No adverse symptoms noted during the session other than 6/10 "tail bone" pain, nursing notified.  Pt will benefit from HHPT upon discharge to safely address deficits listed in patient problem list for decreased caregiver assistance and eventual return to PLOF.      Follow Up Recommendations Home health PT; Supervision with OOB/mobility     Equipment Recommendations  Rolling walker with 5" wheels    Recommendations for Other Services       Precautions / Restrictions Precautions Precautions:  Fall Restrictions Weight Bearing Restrictions: No      Mobility  Bed Mobility Overal bed mobility: Modified Independent             General bed mobility comments: Extra time and effort only    Transfers Overall transfer level: Needs assistance Equipment used: Rolling walker (2 wheeled) Transfers: Sit to/from Stand Sit to Stand: Min assist         General transfer comment: Min A initially to address mild posterior instability but then CGA once steady  Ambulation/Gait Ambulation/Gait assistance: Min guard Gait Distance (Feet): 2 Feet Assistive device: Rolling walker (2 wheeled) Gait Pattern/deviations: Step-to pattern Gait velocity: decreased   General Gait Details: Min instability but able to self-correct; HR to 148 with limited amb, nursing/MD notified  Stairs            Wheelchair Mobility    Modified Rankin (Stroke Patients Only)       Balance Overall balance assessment: Needs assistance   Sitting balance-Leahy Scale: Good     Standing balance support: Bilateral upper extremity supported;During functional activity Standing balance-Leahy Scale: Poor Standing balance comment: Min A for posterior instability in standing                             Pertinent Vitals/Pain Pain Assessment: 0-10 Pain Score: 6  Pain Location: "tail bone" Pain Descriptors / Indicators: Sore Pain Intervention(s): Monitored during session;Patient requesting pain meds-RN notified    Home Living Family/patient expects to be discharged to:: Private residence Living Arrangements: Children (Lives with daughter) Available Help at Discharge: Family;Available  PRN/intermittently Type of Home: Apartment Home Access: Stairs to enter Entrance Stairs-Rails: None Entrance Stairs-Number of Steps: 1 Home Layout: One level Home Equipment: None      Prior Function Level of Independence: Independent         Comments: Ind amb community distances without an AD, no  fall history, Ind with ADLs     Hand Dominance   Dominant Hand: Right    Extremity/Trunk Assessment   Upper Extremity Assessment Upper Extremity Assessment: Generalized weakness;LUE deficits/detail;RUE deficits/detail RUE Deficits / Details: RUE strength grossly 4/5; mildy decreased fine and gross motor coordination but equal L/R RUE Sensation: WNL LUE Deficits / Details: LUE strength grossly 4/5; mildy decreased fine and gross motor coordination but equal L/R LUE Sensation: WNL    Lower Extremity Assessment Lower Extremity Assessment: Generalized weakness;RLE deficits/detail;LLE deficits/detail RLE Deficits / Details: RLE strength grossly 4/5 RLE Sensation: WNL LLE Deficits / Details: LLE strength grossly 4/5 LLE Sensation: WNL       Communication   Communication: HOH  Cognition Arousal/Alertness: Awake/alert Behavior During Therapy: WFL for tasks assessed/performed Overall Cognitive Status: Within Functional Limits for tasks assessed                                        General Comments      Exercises Total Joint Exercises Ankle Circles/Pumps: AROM;Strengthening;Both;10 reps Quad Sets: Strengthening;Both;10 reps Gluteal Sets: Strengthening;Both;10 reps Long Arc Quad: AROM;Strengthening;Both;10 reps Other Exercises Other Exercises: HEP education for BLE APs, QS, and GS x 10 each every 1-2 hrs daily   Assessment/Plan    PT Assessment Patient needs continued PT services  PT Problem List Decreased strength;Decreased activity tolerance;Decreased balance;Decreased knowledge of use of DME       PT Treatment Interventions DME instruction;Gait training;Functional mobility training;Stair training;Therapeutic activities;Therapeutic exercise;Balance training;Patient/family education    PT Goals (Current goals can be found in the Care Plan section)  Acute Rehab PT Goals Patient Stated Goal: To be able to get back home PT Goal Formulation: With  patient Time For Goal Achievement: 10/17/20 Potential to Achieve Goals: Good    Frequency Min 2X/week   Barriers to discharge        Co-evaluation               AM-PAC PT "6 Clicks" Mobility  Outcome Measure Help needed turning from your back to your side while in a flat bed without using bedrails?: None Help needed moving from lying on your back to sitting on the side of a flat bed without using bedrails?: None Help needed moving to and from a bed to a chair (including a wheelchair)?: A Little Help needed standing up from a chair using your arms (e.g., wheelchair or bedside chair)?: A Little Help needed to walk in hospital room?: A Little Help needed climbing 3-5 steps with a railing? : A Little 6 Click Score: 20    End of Session Equipment Utilized During Treatment: Gait belt Activity Tolerance: Other (comment) (session limited by tachycardia) Patient left: in bed;with call bell/phone within reach;with family/visitor present Nurse Communication: Mobility status;Other (comment) (increased HR with activity) PT Visit Diagnosis: Unsteadiness on feet (R26.81);Difficulty in walking, not elsewhere classified (R26.2);Muscle weakness (generalized) (M62.81)    Time: 4196-2229 PT Time Calculation (min) (ACUTE ONLY): 39 min   Charges:   PT Evaluation $PT Eval Moderate Complexity: 1 Mod  Linus Salmons PT, DPT 10/04/20, 12:13 PM

## 2020-10-04 NOTE — Evaluation (Signed)
Occupational Therapy Evaluation Patient Details Name: Kimberly Fisher MRN: [id number] DOB: 1956/02/26 Today's Date: 10/04/2020    History of Present Illness Kimberly Fisher is a 65 y.o. female with medical history significant for anxiety, GERD, hypertension, neuropathy, presents the emergency department for chief concerns of right eye double vision and right eye droop.  MD assessment includes: Hypertensive urgency, double vision, stroke-like symptoms, and sinus tachycardia.  Per MD MRI impression included "No evidence of acute intracranial abnormality, chronic lacunar infarcts within the bilateral basal ganglia, posterior limb of left internal capsule, left thalamus and left pons".   Clinical Impression   Kimberly Fisher was seen for OT evaluation this date. Prior to hospital admission, Kimberly Fisher was Independent for mobility and ADLs. Kimberly Fisher lives with daughter in 1st floor apartment. Kimberly Fisher presents to acute OT demonstrating impaired ADL performance and functional mobility 2/2 decreased activity tolerance, functional strength/balance deficits, and impaired vision. Kimberly Fisher currently requires SETUP + SUPERVISION don B socks and hair brushing seated EOB. MIN A + HHA for ADL t/f. Kimberly Fisher would benefit from skilled OT to address noted impairments and functional limitations (see below for any additional details) in order to maximize safety and independence while minimizing falls risk and caregiver burden. Upon hospital discharge, recommend HHOT to maximize Kimberly Fisher safety and return to functional independence during meaningful occupations of daily life.     Follow Up Recommendations  Home health OT;Supervision - Intermittent    Equipment Recommendations  3 in 1 bedside commode;Other (comment) (2WW)    Recommendations for Other Services       Precautions / Restrictions Precautions Precautions: Fall Restrictions Weight Bearing Restrictions: No      Mobility Bed Mobility Overal bed mobility: Modified Independent             General bed  mobility comments: Extra time and effort only    Transfers Overall transfer level: Needs assistance Equipment used: 1 person hand held assist Transfers: Sit to/from Stand Sit to Stand: Min assist         General transfer comment: Kimberly Fisher reports anxiousness re: standing - improved with HHA    Balance Overall balance assessment: Needs assistance   Sitting balance-Leahy Scale: Good     Standing balance support: Single extremity supported Standing balance-Leahy Scale: Fair Standing balance comment: Min A for posterior instability in standing                           ADL either performed or assessed with clinical judgement   ADL Overall ADL's : Needs assistance/impaired                                       General ADL Comments: SETUP + SUPERVISION don B socks and hair brushing seated EOB. MIN A + HHA for ADL t/f.     Vision Patient Visual Report: Diplopia (improved with R eye closed) Vision Assessment?: Vision impaired- to be further tested in functional context            Pertinent Vitals/Pain Pain Assessment: Faces Pain Score: 6  Faces Pain Scale: Hurts a little bit Pain Location: L wrist IV site Pain Descriptors / Indicators: Sore Pain Intervention(s): Limited activity within patient's tolerance;Repositioned     Hand Dominance Right   Extremity/Trunk Assessment Upper Extremity Assessment Upper Extremity Assessment: Generalized weakness;LUE deficits/detail;RUE deficits/detail RUE Deficits / Details: RUE strength grossly 4/5; mildy decreased fine and  gross motor coordination but equal L/R RUE Sensation: WNL RUE Coordination: decreased fine motor LUE Deficits / Details: LUE strength grossly 4/5; mildy decreased fine and gross motor coordination but equal L/R LUE Sensation: WNL LUE Coordination: decreased fine motor   Lower Extremity Assessment Lower Extremity Assessment: Generalized weakness RLE Deficits / Details: RLE strength  grossly 4/5 RLE Sensation: WNL LLE Deficits / Details: LLE strength grossly 4/5 LLE Sensation: WNL       Communication Communication Communication: HOH   Cognition Arousal/Alertness: Awake/alert Behavior During Therapy: WFL for tasks assessed/performed Overall Cognitive Status: Within Functional Limits for tasks assessed                                        Exercises Exercises: Other exercises Other Exercises Other Exercises: Kimberly Fisher and family educated re: OT role, DME recs, d/c recs, falls prevention, ECS, visual scanning strategies Other Exercises: LBD, grooming, sup<>sit, sit<>stand, sitting/standing balance/tolerance   Shoulder Instructions      Home Living Family/patient expects to be discharged to:: Private residence Living Arrangements: Children (daughter) Available Help at Discharge: Family;Available PRN/intermittently Type of Home: Apartment Home Access: Stairs to enter Entrance Stairs-Number of Steps: 1 Entrance Stairs-Rails: None Home Layout: One level     Bathroom Shower/Tub: Teacher, early years/pre: Handicapped height Bathroom Accessibility: Yes How Accessible: Accessible via walker Home Equipment: None          Prior Functioning/Environment Level of Independence: Independent        Comments: Ind amb community distances without an AD, no fall history, Ind with ADLs        OT Problem List: Decreased strength;Decreased activity tolerance;Impaired balance (sitting and/or standing);Impaired vision/perception;Decreased safety awareness      OT Treatment/Interventions: Self-care/ADL training;Therapeutic exercise;Energy conservation;DME and/or AE instruction;Therapeutic activities;Visual/perceptual remediation/compensation;Patient/family education;Balance training    OT Goals(Current goals can be found in the care plan section) Acute Rehab OT Goals Patient Stated Goal: To be able to get back home OT Goal Formulation: With  patient/family Time For Goal Achievement: 10/18/20 Potential to Achieve Goals: Good ADL Goals Kimberly Fisher Will Perform Eating: Independently;sitting Kimberly Fisher Will Transfer to Toilet: with modified independence;ambulating;regular height toilet (c LRAD PRN) Additional ADL Goal #1: Kimberly Fisher will Independently integrate x3 visual scanning strategies into ADL tasks  OT Frequency: Min 2X/week    AM-PAC OT "6 Clicks" Daily Activity     Outcome Measure Help from another person eating meals?: A Little Help from another person taking care of personal grooming?: A Little Help from another person toileting, which includes using toliet, bedpan, or urinal?: A Little Help from another person bathing (including washing, rinsing, drying)?: A Little Help from another person to put on and taking off regular upper body clothing?: A Little Help from another person to put on and taking off regular lower body clothing?: A Little 6 Click Score: 18   End of Session    Activity Tolerance: Patient tolerated treatment well Patient left: in bed;with call bell/phone within reach;with nursing/sitter in room;with family/visitor present  OT Visit Diagnosis: Other abnormalities of gait and mobility (R26.89);Other symptoms and signs involving the nervous system (R29.898)                Time: 3536-1443 OT Time Calculation (min): 27 min Charges:  OT General Charges $OT Visit: 1 Visit OT Evaluation $OT Eval Low Complexity: 1 Low OT Treatments $Self Care/Home Management : 23-37 mins  Dessie Coma, M.S. OTR/L  10/04/20, 2:52 PM  ascom [phone]

## 2020-10-04 NOTE — Progress Notes (Signed)
Patient continued to c/o abdominal discomfort after receiving the Flomax. Patient Bladder Scanned with a residual of 416 mls noted. Order given by Dr. Sidney Ace to In & Out Cath patient. In & Out Cath completed with a result of 500 mls. Patient stated she feel minimum relief. Dr. Sidney Ace notified

## 2020-10-04 NOTE — Progress Notes (Addendum)
SLP Cancellation Note  Patient Details Name: Kimberly Fisher MRN: [id number] DOB: Sep 26, 1955   Cancelled treatment:       Reason Eval/Treat Not Completed: SLP screened, no needs identified, will sign off (chart reviewed; consulted NSG; met w/ pt/Dtr). Pt denied any difficulty swallowing and is currently on a regular diet; ate at [employer] last night w/out coughing or discomfort reported by pt. Pt tolerates swallowing pills w/ water per NSG. Pt conversed in conversation w/out expressive/receptive deficits noted; pt denied any speech-language deficits. Speech clear. Pt is Edentulous. Pt is HOH w/ R ear dominant for hearing per pt. Vision deficits in R eye. Pt wants to get out of bed and is c/o possible constipation(?). NSG updated. No further skilled ST services indicated as pt appears at her baseline. Pt and Daughter agreed. NSG to reconsult if any change in status while admitted.        Orinda Kenner, MS, CCC-SLP Speech Language Pathologist [facility] [phone] [facility] 10/04/2020, 9:06 AM

## 2020-12-26 ENCOUNTER — Other Ambulatory Visit: Payer: Self-pay | Admitting: Family Medicine

## 2020-12-26 DIAGNOSIS — Z1231 Encounter for screening mammogram for malignant neoplasm of breast: Secondary | ICD-10-CM

## 2021-08-20 ENCOUNTER — Encounter: Payer: Self-pay | Admitting: Emergency Medicine

## 2021-08-20 ENCOUNTER — Emergency Department: Payer: Medicare PPO

## 2021-08-20 ENCOUNTER — Emergency Department
Admission: EM | Admit: 2021-08-20 | Discharge: 2021-08-20 | Disposition: A | Discharge status: Home / Self Care | Payer: Medicare PPO | Attending: Emergency Medicine | Admitting: Emergency Medicine

## 2021-08-20 ENCOUNTER — Other Ambulatory Visit: Payer: Self-pay

## 2021-08-20 DIAGNOSIS — R109 Unspecified abdominal pain: Secondary | ICD-10-CM | POA: Diagnosis present

## 2021-08-20 DIAGNOSIS — K581 Irritable bowel syndrome with constipation: Secondary | ICD-10-CM | POA: Insufficient documentation

## 2021-08-20 DIAGNOSIS — K580 Irritable bowel syndrome with diarrhea: Secondary | ICD-10-CM | POA: Diagnosis not present

## 2021-08-20 DIAGNOSIS — N2889 Other specified disorders of kidney and ureter: Secondary | ICD-10-CM | POA: Insufficient documentation

## 2021-08-20 DIAGNOSIS — K625 Hemorrhage of anus and rectum: Secondary | ICD-10-CM | POA: Insufficient documentation

## 2021-08-20 DIAGNOSIS — K582 Mixed irritable bowel syndrome: Secondary | ICD-10-CM

## 2021-08-20 LAB — COMPREHENSIVE METABOLIC PANEL
ALT: 19 U/L (ref 0–44)
AST: 21 U/L (ref 15–41)
Albumin: 3.7 g/dL (ref 3.5–5.0)
Alkaline Phosphatase: 98 U/L (ref 38–126)
Anion gap: 6 (ref 5–15)
BUN: 18 mg/dL (ref 8–23)
CO2: 24 mmol/L (ref 22–32)
Calcium: 9.2 mg/dL (ref 8.9–10.3)
Chloride: 110 mmol/L (ref 98–111)
Creatinine, Ser: 1.04 mg/dL — ABNORMAL HIGH (ref 0.44–1.00)
GFR, Estimated: >60 mL/min (ref >60)
Glucose, Bld: 115 mg/dL — ABNORMAL HIGH (ref 70–99)
Potassium: 3.9 mmol/L (ref 3.5–5.1)
Sodium: 140 mmol/L (ref 135–145)
Total Bilirubin: 0.4 mg/dL (ref 0.3–1.2)
Total Protein: 7 g/dL (ref 6.5–8.1)

## 2021-08-20 LAB — URINALYSIS, ROUTINE W REFLEX MICROSCOPIC
Bacteria, UA: NONE SEEN
Bilirubin Urine: NEGATIVE
Glucose, UA: NEGATIVE mg/dL
Ketones, ur: NEGATIVE mg/dL
Leukocytes,Ua: NEGATIVE
Nitrite: NEGATIVE
Protein, ur: 30 mg/dL — AB
Specific Gravity, Urine: 1.024 (ref 1.005–1.030)
pH: 5 (ref 5.0–8.0)

## 2021-08-20 LAB — CBC
HCT: 45.9 % (ref 36.0–46.0)
Hemoglobin: 15 g/dL (ref 12.0–15.0)
MCH: 29.9 pg (ref 26.0–34.0)
MCHC: 32.7 g/dL (ref 30.0–36.0)
MCV: 91.4 fL (ref 80.0–100.0)
Platelets: 232 10*3/uL (ref 150–400)
RBC: 5.02 MIL/uL (ref 3.87–5.11)
RDW: 12.5 % (ref 11.5–15.5)
WBC: 8.4 10*3/uL (ref 4.0–10.5)
nRBC: 0 % (ref 0.0–0.2)

## 2021-08-20 LAB — LIPASE, BLOOD: Lipase: 39 U/L (ref 11–51)

## 2021-08-20 MED ORDER — SODIUM CHLORIDE 0.9 % IV BOLUS
1000.0000 mL | Freq: Once | INTRAVENOUS | Status: AC
  Administered 2021-08-20: 1000 mL via INTRAVENOUS

## 2021-08-20 MED ORDER — AMLODIPINE BESYLATE 5 MG PO TABS
5.0000 mg | ORAL_TABLET | Freq: Once | ORAL | Status: AC
  Administered 2021-08-20: 5 mg via ORAL
  Filled 2021-08-20: qty 1

## 2021-08-20 MED ORDER — DICYCLOMINE HCL 10 MG PO CAPS: 10.0000 mg | ORAL_CAPSULE | Freq: Three times a day (TID) | ORAL | 1 refills | Status: DC

## 2021-08-20 MED ORDER — MORPHINE SULFATE (PF) 4 MG/ML IV SOLN
4.0000 mg | Freq: Once | INTRAVENOUS | Status: AC
  Administered 2021-08-20: 4 mg via INTRAVENOUS
  Filled 2021-08-20: qty 1

## 2021-08-20 MED ORDER — ONDANSETRON HCL 4 MG/2ML IJ SOLN
4.0000 mg | Freq: Once | INTRAMUSCULAR | Status: AC
  Administered 2021-08-20: 4 mg via INTRAVENOUS
  Filled 2021-08-20: qty 2

## 2021-08-20 MED ORDER — IOHEXOL 300 MG/ML  SOLN
80.0000 mL | Freq: Once | INTRAMUSCULAR | Status: AC | PRN
  Administered 2021-08-20: 80 mL via INTRAVENOUS

## 2021-08-20 MED ORDER — LOPERAMIDE HCL 2 MG PO TABS: 2.0000 mg | ORAL_TABLET | Freq: Four times a day (QID) | ORAL | 0 refills | Status: DC | PRN

## 2021-08-20 NOTE — ED Notes (Signed)
Pt verbalized understanding of discharge instructions, prescriptions, and follow-up care instructions. Pt advised if symptoms worsen to return to ED.  

## 2021-08-20 NOTE — Discharge Instructions (Addendum)
Follow-up with your primary care for the incidental finding on your right kidney.  Again this may be a cyst, but does need to be evaluated further.  They will need to order an MRI to further evaluate this area.  Pick up a probiotic to go with the prescribed medications to help your irritable bowel syndrome symptoms.

## 2021-08-20 NOTE — ED Triage Notes (Signed)
Pt via POV from home. Pt c/o possible constipation, states she had a small bowel movement last night and since then she has been having diarrhea. Pt also have some external hemorrhoids, also endorsing some bright red blood. Pt c/o lower abd pain and nausea also. Denies fever. Pt also endorses some urinary urgency and discomfort when she urinates. Pt is A&OX4 and NAD

## 2021-08-20 NOTE — ED Provider Notes (Signed)
[facility] Provider Note  Patient Contact: 4:12 PM (approximate)   History   Constipation and Rectal Bleeding   HPI  Kimberly Fisher is a 66 y.o. female who presents the emergency department complaining of abdominal pain, constipation turning into diarrhea and possible urinary changes.  Patient states that she has IBS, struggles with constipation and diarrhea on a regular basis.  She states that she has been constipated in the last 2 to 3 weeks.  Had a small bowel movement yesterday that is turned into significant diarrhea today.  She had some pain in her lower left abdomen.  Patient states that constipation and diarrhea is not atypical for her though the amount of diarrhea is a significant change from normal for her.  No fevers or chills, emesis.  Patient has had some urinary changes with some burning and frequency as well as darker urine than normal.  No hematuria.  Patient states that she does have a hemorrhoid and has some spotty blood when she wipes only.  There is no blood in her stool, and that this is consistent with wiping her hemorrhoid.  No change from normal on amount of bleeding.     Physical Exam   Triage Vital Signs: ED Triage Vitals  Enc Vitals Group     BP 08/20/21 1516 (!) 180/107     Pulse Rate 08/20/21 1516 (!) 119     Resp 08/20/21 1516 20     Temp 08/20/21 1516 98.5 F (36.9 C)     Temp Source 08/20/21 1516 Oral     SpO2 08/20/21 1516 94 %     Weight 08/20/21 1517 180 lb (81.6 kg)     Height 08/20/21 1517 '4\' 11"'$  (1.499 m)     Head Circumference --      Peak Flow --      Pain Score 08/20/21 1517 10     Pain Loc --      Pain Edu? --      Excl. in [place]? --     Most recent vital signs: Vitals:   08/20/21 1516  BP: (!) 180/107  Pulse: (!) 119  Resp: 20  Temp: 98.5 F (36.9 C)  SpO2: 94%     General: Alert and in no acute distress.   Cardiovascular:  Good peripheral perfusion Respiratory: Normal respiratory effort without  tachypnea or retractions. Lungs CTAB. Good air entry to the bases with no decreased or absent breath sounds Gastrointestinal: Bowel sounds 4 quadrants.  Soft to palpation all quadrants.  Patient is slightly tender in the left lower quadrant left central lower abdomen.  No palpable abnormality.. No guarding or rigidity. No palpable masses. No distention. No CVA tenderness. Musculoskeletal: Full range of motion to all extremities.  Neurologic:  No gross focal neurologic deficits are appreciated.  Skin:   No rash noted Other:   ED Results / Procedures / Treatments   Labs (all labs ordered are listed, but only abnormal results are displayed) Labs Reviewed  COMPREHENSIVE METABOLIC PANEL - Abnormal; Notable for the following components:      Result Value   Glucose, Bld 115 (*)    Creatinine, Ser 1.04 (*)    All other components within normal limits  URINALYSIS, ROUTINE W REFLEX MICROSCOPIC - Abnormal; Notable for the following components:   Color, Urine YELLOW (*)    APPearance HAZY (*)    Hgb urine dipstick SMALL (*)    Protein, ur 30 (*)    All other components within  normal limits  LIPASE, BLOOD  CBC     EKG     RADIOLOGY  I personally viewed, evaluated, and interpreted these images as part of my medical decision making, as well as reviewing the written report by the radiologist.  ED Provider Interpretation: No concerning findings regarding colitis, diverticulitis, obstruction to explain patient's symptoms.  Incidental finding of dense lesion on the right kidney.  Patient has other cyst within the liver, adrenal gland.  CT ABDOMEN PELVIS W CONTRAST  Result Date: 08/20/2021 CLINICAL DATA:  Abdominal pain, constipation EXAM: CT ABDOMEN AND PELVIS WITH CONTRAST TECHNIQUE: Multidetector CT imaging of the abdomen and pelvis was performed using the standard protocol following bolus administration of intravenous contrast. RADIATION DOSE REDUCTION: This exam was performed according to  the departmental dose-optimization program which includes automated exposure control, adjustment of the mA and/or kV according to patient size and/or use of iterative reconstruction technique. CONTRAST:  16m OMNIPAQUE IOHEXOL 300 MG/ML  SOLN COMPARISON:  06/28/2015 FINDINGS: Lower chest: Visualized lower lung fields are clear. Hepatobiliary: There is 2.1 cm fluid density lesion in the left lobe of liver with no significant interval change. There are few other subcentimeter low-density lesions in the liver. Some of the smaller lesions were not distinctly visualized in the previous examination which may have been due to timing of the imaging sequence or suggest interval appearance of new lesions. There is no dilation of bile ducts. Gallbladder is unremarkable. Pancreas: No new focal abnormality is seen. Spleen: Unremarkable. Adrenals/Urinary Tract: There is 1.6 cm right adrenal nodule which has not changed significantly. There is mild hyperplasia of left adrenal without discrete nodules. There is no hydronephrosis. There are no renal or ureteral stones. There are few smooth marginated low-density lesions in both kidneys largest measuring 2.1 cm in the lower pole of right kidney suggesting renal cysts. There is 9 mm new exophytic lesion in the posterior upper pole of right kidney. Density measurements are higher than usual for simple cyst. Ureters are not dilated. Urinary bladder is not distended. Stomach/Bowel: There is moderate sized fixed hiatal hernia. Stomach is not distended. Small bowel loops are not dilated. Appendix is not dilated. Scattered diverticula are seen in colon without signs of focal diverticulitis. Vascular/Lymphatic: There are scattered atherosclerotic plaques and calcifications Reproductive: Uterus is unremarkable. There is 1.5 cm structure with peripheral rim of calcification in the left side of pelvis. This structure could not be connected to the adnexa. Other: There is no ascites or  pneumoperitoneum. There is epigastric ventral hernia anterior to the left lobe of liver measuring proximally 9.5 x 4 x 4 cm. There is fat within the hernial sac. There is no demonstrable bowel loop within the hernia. There is interval increase in fluid density within the hernial sac. There is also fluid anterior to the heart and left lobe of liver measuring 4.6 x 2.8 cm. There is no demonstrable pericardial effusion. Small umbilical hernia containing fat is seen. Musculoskeletal: No acute findings are seen. IMPRESSION: There is no evidence of intestinal obstruction or pneumoperitoneum. There is no hydronephrosis. Appendix is not dilated. There is new 9 mm exophytic lesion in the posterior margin of upper pole of right kidney with density measurements higher than usual for simple cyst. This may suggest a hyperdense hemorrhagic cyst or neoplastic process such as renal cell carcinoma. Follow-up renal sonogram and MRI if warranted should be considered. There is 1.6 cm nodule in the right adrenal which has not changed significantly since 06/28/2015 suggesting possible adenoma. There  is 2.1 cm cyst in the left lobe of liver which appears stable. There are few other smaller low-density lesions in the liver which were not distinctly visualized in the previous examination. These may still be benign process such as cysts or hemangiomas. Follow-up CT in 3 months may be considered to assess stability. Moderate sized fixed hiatal hernia. Fatty liver. Scattered diverticula are seen in the colon without signs of diverticulitis. There is 9.5 x 4 x 4 cm epigastric ventral hernia containing fat and fluid in the hernial sac. There is also fluid collection immediately posterior to the epigastric ventral hernia. This may suggest nonspecific inflammation in the hernia. There is no bowel loop within the hernia. Electronically Signed   By: Elmer Picker M.D.   On: 08/20/2021 18:05    PROCEDURES:  Critical Care performed:  No  Procedures   MEDICATIONS ORDERED IN ED: Medications  iohexol (OMNIPAQUE) 300 MG/ML solution 80 mL (80 mLs Intravenous Contrast Given 08/20/21 1737)  morphine (PF) 4 MG/ML injection 4 mg (4 mg Intravenous Given 08/20/21 1813)  ondansetron (ZOFRAN) injection 4 mg (4 mg Intravenous Given 08/20/21 1813)  sodium chloride 0.9 % bolus 1,000 mL (1,000 mLs Intravenous New Bag/Given 08/20/21 1803)     IMPRESSION / MDM / ASSESSMENT AND PLAN / ED COURSE  I reviewed the triage vital signs and the nursing notes.                              Differential diagnosis includes, but is not limited to, irritable bowel syndrome, colitis, diverticulitis, UTI  Patient's presentation is most consistent with severe exacerbation of chronic illness.    Patient's diagnosis is consistent with irritable bowel symptoms, renal mass.  Patient presents the emergency department with what likely is irritable bowel symptoms but was worse than normal.  Patient has labs which are reassuring as well as imaging which revealed no evidence of obstruction, colitis, diverticulitis, perforation.  During her work-up, patient did have an incidental finding of a dense lesion to the right kidney.  This may be a complex cyst but given the density is concerning for other process such as malignancy.  Patient will follow-up with primary care for MRI to further evaluate this area.  Patient will have prescriptions for symptom relief of IBS symptoms.  Return precautions discussed with the patient and her daughter at this time.. Patient is given ED precautions to return to the ED for any worsening or new symptoms.        FINAL CLINICAL IMPRESSION(S) / ED DIAGNOSES   Final diagnoses:  Irritable bowel syndrome with both constipation and diarrhea  Mass of kidney of unknown nature     Rx / DC Orders   ED Discharge Orders          Ordered    dicyclomine (BENTYL) 10 MG capsule  3 times daily before meals & bedtime        08/20/21 1858     loperamide (IMODIUM A-D) 2 MG tablet  4 times daily PRN        08/20/21 1858             Note:  This document was prepared using Dragon voice recognition software and may include unintentional dictation errors.   Brynda Peon 08/20/21 1901    Rada Hay, MD 08/20/21 1946

## 2021-08-20 NOTE — ED Notes (Signed)
Pt in CT.

## 2021-08-20 NOTE — ED Notes (Signed)
Pt CO pain at this time as she returned from CT. CT tech stating they started 2PIVs L arm and L hand both flushed great for exam. Pt stating they want their Ivs removed.

## 2021-08-25 ENCOUNTER — Emergency Department
Admission: EM | Admit: 2021-08-25 | Discharge: 2021-08-25 | Disposition: A | Discharge status: Home / Self Care | Payer: Medicare PPO | Attending: Emergency Medicine | Admitting: Emergency Medicine

## 2021-08-25 ENCOUNTER — Other Ambulatory Visit: Payer: Self-pay

## 2021-08-25 DIAGNOSIS — T887XXA Unspecified adverse effect of drug or medicament, initial encounter: Secondary | ICD-10-CM | POA: Diagnosis not present

## 2021-08-25 DIAGNOSIS — T443X5A Adverse effect of other parasympatholytics [anticholinergics and antimuscarinics] and spasmolytics, initial encounter: Secondary | ICD-10-CM | POA: Insufficient documentation

## 2021-08-25 DIAGNOSIS — G253 Myoclonus: Secondary | ICD-10-CM | POA: Diagnosis not present

## 2021-08-25 DIAGNOSIS — R202 Paresthesia of skin: Secondary | ICD-10-CM | POA: Diagnosis present

## 2021-08-25 DIAGNOSIS — I10 Essential (primary) hypertension: Secondary | ICD-10-CM | POA: Insufficient documentation

## 2021-08-25 DIAGNOSIS — R200 Anesthesia of skin: Secondary | ICD-10-CM

## 2021-08-25 LAB — CBC
HCT: 46.3 % — ABNORMAL HIGH (ref 36.0–46.0)
Hemoglobin: 15 g/dL (ref 12.0–15.0)
MCH: 29.5 pg (ref 26.0–34.0)
MCHC: 32.4 g/dL (ref 30.0–36.0)
MCV: 91.1 fL (ref 80.0–100.0)
Platelets: 246 10*3/uL (ref 150–400)
RBC: 5.08 MIL/uL (ref 3.87–5.11)
RDW: 12.5 % (ref 11.5–15.5)
WBC: 7.3 10*3/uL (ref 4.0–10.5)
nRBC: 0 % (ref 0.0–0.2)

## 2021-08-25 LAB — COMPREHENSIVE METABOLIC PANEL
ALT: 21 U/L (ref 0–44)
AST: 20 U/L (ref 15–41)
Albumin: 3.8 g/dL (ref 3.5–5.0)
Alkaline Phosphatase: 88 U/L (ref 38–126)
Anion gap: 5 (ref 5–15)
BUN: 13 mg/dL (ref 8–23)
CO2: 25 mmol/L (ref 22–32)
Calcium: 9 mg/dL (ref 8.9–10.3)
Chloride: 110 mmol/L (ref 98–111)
Creatinine, Ser: 1.06 mg/dL — ABNORMAL HIGH (ref 0.44–1.00)
GFR, Estimated: 59 mL/min — ABNORMAL LOW (ref >60)
Glucose, Bld: 113 mg/dL — ABNORMAL HIGH (ref 70–99)
Potassium: 3.5 mmol/L (ref 3.5–5.1)
Sodium: 140 mmol/L (ref 135–145)
Total Bilirubin: 0.6 mg/dL (ref 0.3–1.2)
Total Protein: 7.2 g/dL (ref 6.5–8.1)

## 2021-08-25 NOTE — ED Triage Notes (Signed)
Pt states yesterday she started with difficulty breathing and swallowing- daughter gave her benadryl and pt went to bed- today pt is having similar symptoms but now with numbness all over, jerky movements, and difficulty ambulating- pt was seen here a few days ago and given a new medication for IBS- pt able to speak in complete sentences without difficulty

## 2021-08-25 NOTE — ED Provider Notes (Signed)
 [facility] Provider Note   Event Date/Time   First MD Initiated Contact with Patient 08/25/21 1849     (approximate) History  Numbness  HPI Kimberly Fisher is a 65 y.o. female  Location: Generalized, throat Duration: 48 hours prior to arrival Timing: Mildly improving since onset Severity: 0/10 Quality: Paresthesias, numbness, myoclonic jerking, hypertension, dry throat, itching dry skin Context: Patient states that she began taking a new medication, Bentyl, 7 days prior to arrival and 4 days after she began taking it, she developed worsening symptoms of numbness, myoclonic jerks, hypertension, dry/sore throat, and itching dry skin Modifying factors: Patient states symptoms mildly improved after stopping Bentyl Associated Symptoms: None ROS: Patient currently denies any vision changes, tinnitus, difficulty speaking, facial droop, sore throat, chest pain, shortness of breath, abdominal pain, nausea/vomiting/diarrhea, dysuria   Physical Exam  Triage Vital Signs: ED Triage Vitals  Enc Vitals Group     BP 08/25/21 1747 (!) 231/136     Pulse Rate 08/25/21 1747 93     Resp 08/25/21 1747 20     Temp 08/25/21 1747 98.3 F (36.8 C)     Temp Source 08/25/21 1747 Oral     SpO2 08/25/21 1747 97 %     Weight 08/25/21 1744 200 lb (90.7 kg)     Height 08/25/21 1744 4' 11" (1.499 m)     Head Circumference --      Peak Flow --      Pain Score 08/25/21 1744 0     Pain Loc --      Pain Edu? --      Excl. in [place]? --    Most recent vital signs: Vitals:   08/25/21 1747 08/25/21 1853  BP: (!) 231/136 (!) 215/113  Pulse: 93 92  Resp: 20 18  Temp: 98.3 F (36.8 C)   SpO2: 97% 98%   General: Awake, oriented x4. CV:  Good peripheral perfusion.  Resp:  Normal effort.  Abd:  No distention.  Other:  Elderly Caucasian female laying in bed in no acute distress.  Occasional myoclonic jerking in the right upper extremity.  Generalized xerosis ED Results / Procedures /  Treatments  Labs (all labs ordered are listed, but only abnormal results are displayed) Labs Reviewed  CBC - Abnormal; Notable for the following components:      Result Value   HCT 46.3 (*)    All other components within normal limits  COMPREHENSIVE METABOLIC PANEL - Abnormal; Notable for the following components:   Glucose, Bld 113 (*)    Creatinine, Ser 1.06 (*)    GFR, Estimated 59 (*)    All other components within normal limits  URINALYSIS, ROUTINE W REFLEX MICROSCOPIC   EKG ED ECG REPORT I, Evan K Bradler, the attending physician, personally viewed and interpreted this ECG. Date: 08/25/2021 EKG Time: 1752 Rate: 92 Rhythm: normal sinus rhythm QRS Axis: normal Intervals: normal ST/T Wave abnormalities: normal Narrative Interpretation: no evidence of acute ischemia PROCEDURES: Critical Care performed: No .1-3 Lead EKG Interpretation  Performed by: Bradler, Evan K, MD Authorized by: Bradler, Evan K, MD     Interpretation: normal     ECG rate:  91   ECG rate assessment: normal     Rhythm: sinus rhythm     Ectopy: none     Conduction: normal    MEDICATIONS ORDERED IN ED: Medications - No data to display IMPRESSION / MDM / ASSESSMENT AND PLAN / ED COURSE  I reviewed the triage   vital signs and the nursing notes.                             The patient is on the cardiac monitor to evaluate for evidence of arrhythmia and/or significant heart rate changes. Patient's presentation is most consistent with acute presentation with potential threat to life or bodily function. Patient presents for myriad of symptoms after starting a new medication, Bentyl Patient complains of: Hypertension, numbness, myoclonic jerks, generalized weakness, dry itching skin, dry sore throat  Given history and exam, presentation most consistent with medication reaction. I have low suspicion for toxic shock syndrome, anaphylaxis, asthma exacerbation, or drug toxicity.  Recommended cessation of  Bentyl Rx: Benadryl as needed Disposition: Discharge home with SRP. Follow up with PCP in 1-2 days.   FINAL CLINICAL IMPRESSION(S) / ED DIAGNOSES   Final diagnoses:  Medication side effect  Numbness  Hypertension, unspecified type  Myoclonic jerking   Rx / DC Orders   ED Discharge Orders     None      Note:  This document was prepared using Dragon voice recognition software and may include unintentional dictation errors.   Naaman Plummer, MD 08/25/21 [phone]

## 2021-08-25 NOTE — ED Notes (Signed)
Pt A&O, pt given discharge instructions, pt assisted by daughter to wheelchair.

## 2021-08-25 NOTE — Discharge Instructions (Addendum)
Please do not take any more Bentyl (dicyclomine) Please follow-up with your primary care physician to discuss other treatment options for your IBS You may continue to use Benadryl for any itching

## 2022-11-11 ENCOUNTER — Emergency Department
Admission: EM | Admit: 2022-11-11 | Discharge: 2022-11-11 | Disposition: A | Discharge status: Home / Self Care | Payer: Medicare PPO | Attending: Emergency Medicine | Admitting: Emergency Medicine

## 2022-11-11 ENCOUNTER — Other Ambulatory Visit: Payer: Self-pay

## 2022-11-11 ENCOUNTER — Emergency Department: Payer: Medicare PPO

## 2022-11-11 DIAGNOSIS — I10 Essential (primary) hypertension: Secondary | ICD-10-CM | POA: Diagnosis not present

## 2022-11-11 DIAGNOSIS — K922 Gastrointestinal hemorrhage, unspecified: Secondary | ICD-10-CM | POA: Diagnosis not present

## 2022-11-11 DIAGNOSIS — K649 Unspecified hemorrhoids: Secondary | ICD-10-CM

## 2022-11-11 DIAGNOSIS — K644 Residual hemorrhoidal skin tags: Secondary | ICD-10-CM | POA: Insufficient documentation

## 2022-11-11 DIAGNOSIS — N390 Urinary tract infection, site not specified: Secondary | ICD-10-CM

## 2022-11-11 DIAGNOSIS — K921 Melena: Secondary | ICD-10-CM | POA: Diagnosis present

## 2022-11-11 LAB — CBC WITH DIFFERENTIAL/PLATELET
Abs Immature Granulocytes: 0.03 10*3/uL (ref 0.00–0.07)
Basophils Absolute: 0.1 10*3/uL (ref 0.0–0.1)
Basophils Relative: 1 %
Eosinophils Absolute: 0.2 10*3/uL (ref 0.0–0.5)
Eosinophils Relative: 3 %
HCT: 43.8 % (ref 36.0–46.0)
Hemoglobin: 14.2 g/dL (ref 12.0–15.0)
Immature Granulocytes: 0 %
Lymphocytes Relative: 16 %
Lymphs Abs: 1.1 10*3/uL (ref 0.7–4.0)
MCH: 29.9 pg (ref 26.0–34.0)
MCHC: 32.4 g/dL (ref 30.0–36.0)
MCV: 92.2 fL (ref 80.0–100.0)
Monocytes Absolute: 0.5 10*3/uL (ref 0.1–1.0)
Monocytes Relative: 7 %
Neutro Abs: 5 10*3/uL (ref 1.7–7.7)
Neutrophils Relative %: 73 %
Platelets: 193 10*3/uL (ref 150–400)
RBC: 4.75 MIL/uL (ref 3.87–5.11)
RDW: 12.5 % (ref 11.5–15.5)
WBC: 6.8 10*3/uL (ref 4.0–10.5)
nRBC: 0 % (ref 0.0–0.2)

## 2022-11-11 LAB — URINALYSIS, ROUTINE W REFLEX MICROSCOPIC
Bilirubin Urine: NEGATIVE
Glucose, UA: NEGATIVE mg/dL
Ketones, ur: NEGATIVE mg/dL
Nitrite: NEGATIVE
Protein, ur: 30 mg/dL — AB
Specific Gravity, Urine: 1.023 (ref 1.005–1.030)
pH: 5 (ref 5.0–8.0)

## 2022-11-11 LAB — COMPREHENSIVE METABOLIC PANEL
ALT: 12 U/L (ref 0–44)
AST: 19 U/L (ref 15–41)
Albumin: 3.6 g/dL (ref 3.5–5.0)
Alkaline Phosphatase: 67 U/L (ref 38–126)
Anion gap: 8 (ref 5–15)
BUN: 25 mg/dL — ABNORMAL HIGH (ref 8–23)
CO2: 24 mmol/L (ref 22–32)
Calcium: 8.5 mg/dL — ABNORMAL LOW (ref 8.9–10.3)
Chloride: 106 mmol/L (ref 98–111)
Creatinine, Ser: 1.37 mg/dL — ABNORMAL HIGH (ref 0.44–1.00)
GFR, Estimated: 43 mL/min — ABNORMAL LOW (ref >60)
Glucose, Bld: 140 mg/dL — ABNORMAL HIGH (ref 70–99)
Potassium: 3.6 mmol/L (ref 3.5–5.1)
Sodium: 138 mmol/L (ref 135–145)
Total Bilirubin: 0.8 mg/dL (ref 0.3–1.2)
Total Protein: 6.8 g/dL (ref 6.5–8.1)

## 2022-11-11 LAB — LIPASE, BLOOD: Lipase: 39 U/L (ref 11–51)

## 2022-11-11 MED ORDER — OXYCODONE-ACETAMINOPHEN 5-325 MG PO TABS
1.0000 | ORAL_TABLET | Freq: Once | ORAL | Status: AC
  Administered 2022-11-11: 1 via ORAL
  Filled 2022-11-11: qty 1

## 2022-11-11 MED ORDER — PREPARATION H 0.25-14-74.9 % RE OINT: 1.0000 | TOPICAL_OINTMENT | Freq: Two times a day (BID) | RECTAL | 0 refills | Status: DC | PRN

## 2022-11-11 MED ORDER — CEPHALEXIN 500 MG PO CAPS: 500.0000 mg | ORAL_CAPSULE | Freq: Two times a day (BID) | ORAL | 0 refills | Status: AC

## 2022-11-11 MED ORDER — CEPHALEXIN 500 MG PO CAPS
500.0000 mg | ORAL_CAPSULE | Freq: Once | ORAL | Status: AC
  Administered 2022-11-11: 500 mg via ORAL
  Filled 2022-11-11: qty 1

## 2022-11-11 MED ORDER — TRAMADOL HCL 50 MG PO TABS
50.0000 mg | ORAL_TABLET | Freq: Once | ORAL | Status: AC
  Administered 2022-11-11: 50 mg via ORAL
  Filled 2022-11-11: qty 1

## 2022-11-11 MED ORDER — OXYCODONE-ACETAMINOPHEN 5-325 MG PO TABS
1.0000 | ORAL_TABLET | Freq: Four times a day (QID) | ORAL | 0 refills | Status: AC | PRN
Start: 2022-11-11 — End: 2022-11-16

## 2022-11-11 NOTE — Discharge Instructions (Addendum)
Use the hemorrhoid cream as prescribed.  Take the cephalexin for your urinary tract infection and finish the full 7-day course.  Use the Percocet as needed for pain over the next few days.  You may follow-up with the general surgeon for evaluation and treatment of the hemorrhoids including possible excision.  We have also given you a referral information for 3 of the local primary care practices.  Return to the ER for new, worsening, or persistent severe rectal bleeding, diarrhea, abdominal pain, fever, vomiting, or any other new or worsening symptoms that concern you.

## 2022-11-11 NOTE — ED Provider Notes (Signed)
[facility] Provider Note    Event Date/Time   First MD Initiated Contact with Patient 11/11/22 2107     (approximate)   History   Rectal Bleeding   HPI  Kimberly Fisher is a 67 y.o. female with a history of hypertension, anxiety, GERD, neuropathy who presents with blood in the stool, acute onset today, described as bright red blood and associated with some rectal pain as well as lower abdominal pain the patient reports a history of hemorrhoids.  She feels that it is likely that the hemorrhoid is bleeding.  She denies any nausea or vomiting.  She has no fever or chills.  She denies any urinary symptoms.  She has no prior history of GI bleeding.  She states that the bleeding has now subsided.  I have the past medical records.  The patient was most recent admitted to the hospitalist service in 2022 with diplopia and CVA workup.   Physical Exam   Triage Vital Signs: ED Triage Vitals  Encounter Vitals Group     BP 11/11/22 2046 (!) 179/117     Systolic BP Percentile --      Diastolic BP Percentile --      Pulse Rate 11/11/22 2046 87     Resp 11/11/22 2046 18     Temp 11/11/22 2046 98.3 F (36.8 C)     Temp Source 11/11/22 2046 Oral     SpO2 11/11/22 2046 100 %     Weight 11/11/22 2045 200 lb (90.7 kg)     Height 11/11/22 2045 5' (1.524 m)     Head Circumference --      Peak Flow --      Pain Score 11/11/22 2044 7     Pain Loc --      Pain Education --      Exclude from Growth Chart --     Most recent vital signs: Vitals:   11/11/22 2150 11/11/22 2315  BP: (!) 197/94 (!) 200/100  Pulse:  71  Resp:  (!) 118  Temp:  98 F (36.7 C)  SpO2:  100%     General: Awake, no distress.  CV:  Good peripheral perfusion.  Resp:  Normal effort.  Abd:  Soft with mild bilateral lower quadrant discomfort.  No distention.  Other:  Nonthrombosed external hemorrhoid with no active bleeding and no stigmata of recent bleeding.   ED Results / Procedures /  Treatments   Labs (all labs ordered are listed, but only abnormal results are displayed) Labs Reviewed  COMPREHENSIVE METABOLIC PANEL - Abnormal; Notable for the following components:      Result Value   Glucose, Bld 140 (*)    BUN 25 (*)    Creatinine, Ser 1.37 (*)    Calcium 8.5 (*)    GFR, Estimated 43 (*)    All other components within normal limits  URINALYSIS, ROUTINE W REFLEX MICROSCOPIC - Abnormal; Notable for the following components:   Color, Urine YELLOW (*)    APPearance HAZY (*)    Hgb urine dipstick MODERATE (*)    Protein, ur 30 (*)    Leukocytes,Ua TRACE (*)    Bacteria, UA RARE (*)    All other components within normal limits  LIPASE, BLOOD  CBC WITH DIFFERENTIAL/PLATELET     EKG     RADIOLOGY  CT abdomen/pelvis: I independently viewed and interpreted the images; there are no dilated bowel loops or any free air or free fluid.  IMPRESSION:  Stable changes in the abdomen and pelvis when compared with the  prior exam. No acute abnormality is identified to correspond with  the given clinical history.     PROCEDURES:  Critical Care performed: No  Procedures   MEDICATIONS ORDERED IN ED: Medications  traMADol (ULTRAM) tablet 50 mg (50 mg Oral Given 11/11/22 2141)  cephALEXin (KEFLEX) capsule 500 mg (500 mg Oral Given 11/11/22 2311)  oxyCODONE-acetaminophen (PERCOCET/ROXICET) 5-325 MG per tablet 1 tablet (1 tablet Oral Given 11/11/22 2311)     IMPRESSION / MDM / ASSESSMENT AND PLAN / ED COURSE  I reviewed the triage vital signs and the nursing notes.  67 year old female with PMH as noted above presents with bright red blood in the stool today associated with pain from a nonthrombosed hemorrhoid and some lower abdominal discomfort.  On exam the patient is overall well-appearing.  She is hypertensive with otherwise normal vital signs.  Abdomen is soft with mild lower quadrant discomfort.  Differential diagnosis includes, but is not limited to,  colitis, diverticulitis, diverticulosis, internal hemorrhoid, bleeding from external hemorrhoid.  We will obtain CT for further evaluation as well as lab workup.  Patient's presentation is most consistent with acute complicated illness / injury requiring diagnostic workup.  ----------------------------------------- 11:57 PM on 11/11/2022 -----------------------------------------  Lab workup shows normal hemoglobin.  Given that the patient has no further bleeding there is no indication for repeat.  CMP shows normal electrolytes.  Urinalysis shows WBCs and some bacteria, suggestive of possible UTI.  CT shows no acute abnormalities.  The patient's blood pressure has been elevated, however she has no chest pain, headache, or other acute symptoms of hypertensive crisis or any indication for emergent intervention.  I did consider whether the patient may benefit from inpatient admission given the bleeding, however now that it has subsided and within normal hemoglobin and otherwise reassuring findings on the workup, I think she is appropriate for discharge home.  The patient states she feels well to go home.  I have prescribed her a hemorrhoid cream, Keflex to treat the UTI, and a small quantity of pain medication.  I counseled her on the results of the workup and plan of care.  I have given her primary care and surgery referrals.  I gave strict return precautions and she expressed understanding.   FINAL CLINICAL IMPRESSION(S) / ED DIAGNOSES   Final diagnoses:  Hemorrhoids, unspecified hemorrhoid type  Lower GI bleed  Urinary tract infection without hematuria, site unspecified     Rx / DC Orders   ED Discharge Orders          Ordered    oxyCODONE-acetaminophen (PERCOCET) 5-325 MG tablet  Every 6 hours PRN        11/11/22 2309    cephALEXin (KEFLEX) 500 MG capsule  2 times daily        11/11/22 2309    phenylephrine-shark liver oil-mineral oil-petrolatum (PREPARATION H) 0.25-14-74.9 %  rectal ointment  2 times daily PRN        11/11/22 2309             Note:  This document was prepared using Dragon voice recognition software and may include unintentional dictation errors.    Dionne Bucy, MD 11/11/22 [phone]

## 2022-11-11 NOTE — ED Notes (Signed)
Type and screen sent.

## 2022-11-11 NOTE — ED Triage Notes (Signed)
Pt states she developed rectal bleeding earlier today, pt has hemorrhoids but states she has noticed a lot of bright red blood in the toilet. Pt states stools have been dark, pt also c/o lower abd discomfort. Pt has hx IBS. Pt denies new weakness.

## 2022-11-28 ENCOUNTER — Emergency Department
Admission: EM | Admit: 2022-11-28 | Discharge: 2022-11-28 | Disposition: A | Discharge status: Home / Self Care | Payer: Medicare PPO | Attending: Emergency Medicine | Admitting: Emergency Medicine

## 2022-11-28 DIAGNOSIS — K625 Hemorrhage of anus and rectum: Secondary | ICD-10-CM | POA: Diagnosis present

## 2022-11-28 DIAGNOSIS — K645 Perianal venous thrombosis: Secondary | ICD-10-CM | POA: Diagnosis not present

## 2022-11-28 DIAGNOSIS — I10 Essential (primary) hypertension: Secondary | ICD-10-CM | POA: Diagnosis not present

## 2022-11-28 DIAGNOSIS — K644 Residual hemorrhoidal skin tags: Secondary | ICD-10-CM

## 2022-11-28 MED ORDER — HYDROCORT-PRAMOXINE (PERIANAL) 1-1 % EX FOAM: 1.0000 | Freq: Two times a day (BID) | CUTANEOUS | 1 refills | Status: DC

## 2022-11-28 MED ORDER — LIDOCAINE VISCOUS HCL 2 % MT SOLN
15.0000 mL | Freq: Once | OROMUCOSAL | Status: DC
  Filled 2022-11-28: qty 15

## 2022-11-28 MED ORDER — AMLODIPINE BESYLATE 5 MG PO TABS: 5.0000 mg | ORAL_TABLET | Freq: Every day | ORAL | 2 refills | Status: DC

## 2022-11-28 NOTE — ED Provider Notes (Signed)
[facility] Provider Note    Event Date/Time   First MD Initiated Contact with Patient 11/28/22 1124     (approximate)   History   Rectal Bleeding   HPI Kimberly Fisher is a 67 y.o. female who presents for evaluation of pain and burning with some occasional discharge from her rectum.  She said that it has been going on for several days but just got worse and more painful last night.  She has had issues with hemorrhoids in the past but "I do not know how many are back there or what they look like".  She said that otherwise she feels fine.  She has had no fever, chest pain, shortness of breath, nausea, vomiting, nor abdominal pain.  She denies any burning when she urinates.  Of note, she states that she has no primary care doctor and she knows that she has high blood pressure but she does not take any medication for it.     Physical Exam   Triage Vital Signs: ED Triage Vitals  Encounter Vitals Group     BP 11/28/22 1053 (!) 202/129     Systolic BP Percentile --      Diastolic BP Percentile --      Pulse Rate 11/28/22 1053 92     Resp 11/28/22 1053 18     Temp 11/28/22 1053 98.5 F (36.9 C)     Temp Source 11/28/22 1053 Oral     SpO2 11/28/22 1053 97 %     Weight 11/28/22 1054 72.6 kg (160 lb)     Height 11/28/22 1054 1.524 m (5')     Head Circumference --      Peak Flow --      Pain Score 11/28/22 1054 10     Pain Loc --      Pain Education --      Exclude from Growth Chart --     Most recent vital signs: Vitals:   11/28/22 1053  BP: (!) 202/129  Pulse: 92  Resp: 18  Temp: 98.5 F (36.9 C)  SpO2: 97%    General: Awake, no distress.  CV:  Good peripheral perfusion.  Regular rate and rhythm. Resp:  Normal effort. Speaking easily and comfortably, no accessory muscle usage nor intercostal retractions.   Abd:  No distention.  No tenderness to palpation of the abdomen. Rectal:  Large and tender external hemorrhoid, nonthrombosed, no evidence  of surrounding infection nor anal fissure.  Digital rectal exam is tender only at the site of the hemorrhoid and Hemoccult negative with quality control passed.  ED tech present as chap around throughout exam.   ED Results / Procedures / Treatments   Labs (all labs ordered are listed, but only abnormal results are displayed) Labs Reviewed - No data to display    PROCEDURES:  Critical Care performed: No  Procedures    IMPRESSION / MDM / ASSESSMENT AND PLAN / ED COURSE  I reviewed the triage vital signs and the nursing notes.                              Differential diagnosis includes, but is not limited to, hemorrhoid, anal fissure, perirectal abscess or perianal abscess, less likely diverticulitis.  Patient's presentation is most consistent with exacerbation of chronic illness.   Interventions/Medications given:  Medications  lidocaine (XYLOCAINE) 2 % viscous mouth solution 15 mL (has no administration in time range)    (  Note:  hospital course my include additional interventions and/or labs/studies not listed above.)   I reviewed the medical record and the patient was seen about 3 weeks ago in the ED for the same issue.  She was evaluated thoroughly and had reassuring results.  She remains hypertensive and is clearly not taking her medication and has not followed up with her regular doctor.  She is ambulatory around the ED using a walker, even though it causes some increased pain of her anus/rectum to do so.  I had an extensive conversation with the patient about self-care and the need to treat her hemorrhoid with Sitz bath, ointments, etc.  I also talked with her about the importance of taking blood pressure medicine and following up with an outpatient provider.  I provided a 11-month supply of amlodipine and stressed to her that this is very important because of her uncontrolled hypertension.  She says that she understands and will follow-up.  I applied some viscous lidocaine  to the area for today and she has prescriptions as listed below to fill as an outpatient.         FINAL CLINICAL IMPRESSION(S) / ED DIAGNOSES   Final diagnoses:  External hemorrhoid  Uncontrolled hypertension     Rx / DC Orders   ED Discharge Orders          Ordered    amLODipine (NORVASC) 5 MG tablet  Daily        11/28/22 1252    hydrocortisone-pramoxine (PROCTOFOAM-HC) rectal foam  2 times daily        11/28/22 1252    Ambulatory Referral to Primary Care (Establish Care)        11/28/22 1255             Note:  This document was prepared using Dragon voice recognition software and may include unintentional dictation errors.   Loleta Rose, MD 11/28/22 1257

## 2022-11-28 NOTE — Discharge Instructions (Addendum)
As we discussed, it is important that you read through the included information about hemorrhoid care.  Nothing will make them go away immediately, but they should get better over time.  You can use over-the-counter ointments and creams such as Preparation H in addition to the foam that was prescribed today.  You also need to be caring for your blood pressure.  Please take the prescribed blood pressure medicine once a day as written.  It is very important that you follow-up with a primary care doctor.  We have referred you to primary care and when they call you to schedule an appointment, please follow-up with them.    Return to the emergency department if you develop new or worsening symptoms that concern you.

## 2022-11-28 NOTE — ED Triage Notes (Signed)
Pt states that she has "cloudy colored discharge from her rectum" with small amounts of blood for the last 2-3 days. Pt states she has known hemorrhoid there. No blood thinners per pt. During triage pt states she has HTN and her BP is noted at 202/129 but states that she has no medication for same as she has no PCP currently.

## 2022-11-28 NOTE — ED Notes (Signed)
Lidocaine brought to MD at this time

## 2022-12-19 ENCOUNTER — Other Ambulatory Visit: Payer: Self-pay

## 2022-12-19 ENCOUNTER — Emergency Department
Admission: EM | Admit: 2022-12-19 | Discharge: 2022-12-19 | Disposition: A | Discharge status: Home / Self Care | Payer: Medicare PPO | Attending: Emergency Medicine | Admitting: Emergency Medicine

## 2022-12-19 DIAGNOSIS — I10 Essential (primary) hypertension: Secondary | ICD-10-CM | POA: Insufficient documentation

## 2022-12-19 DIAGNOSIS — K644 Residual hemorrhoidal skin tags: Secondary | ICD-10-CM | POA: Diagnosis present

## 2022-12-19 DIAGNOSIS — Z91199 Patient's noncompliance with other medical treatment and regimen due to unspecified reason: Secondary | ICD-10-CM | POA: Insufficient documentation

## 2022-12-19 MED ORDER — LISINOPRIL 10 MG PO TABS
10.0000 mg | ORAL_TABLET | Freq: Once | ORAL | Status: AC
  Administered 2022-12-19: 10 mg via ORAL
  Filled 2022-12-19: qty 1

## 2022-12-19 MED ORDER — LISINOPRIL 10 MG PO TABS: 10.0000 mg | ORAL_TABLET | Freq: Every day | ORAL | 3 refills | Status: DC

## 2022-12-19 NOTE — ED Provider Notes (Signed)
[facility] Provider Note    Event Date/Time   First MD Initiated Contact with Patient 12/19/22 1004     (approximate)   History   Hemorrhoids   HPI  Kimberly Fisher is a 67 y.o. female   presents to the ED with complaint of hemorrhoids.  Patient reports that she has had hemorrhoids for "years".  She states that today they are hurting really bad.  Patient was noted to have elevated blood pressure in triage at 228/120.  Patient denies any headache, blurry vision, chest pain, shortness of breath, nausea or vomiting.  She reports that she has not gotten any of her blood pressure medication as she has not seen her PCP.  Patient has a history of hypertension, GERD, neuropathy, TIA.    Physical Exam   Triage Vital Signs: ED Triage Vitals  Encounter Vitals Group     BP 12/19/22 0943 (!) 228/120     Systolic BP Percentile --      Diastolic BP Percentile --      Pulse Rate 12/19/22 0943 95     Resp --      Temp 12/19/22 0943 98.5 F (36.9 C)     Temp Source 12/19/22 0943 Oral     SpO2 12/19/22 0943 96 %     Weight 12/19/22 0949 160 lb (72.6 kg)     Height 12/19/22 0949 5\' 4"  (1.626 m)     Head Circumference --      Peak Flow --      Pain Score 12/19/22 0949 10     Pain Loc --      Pain Education --      Exclude from Growth Chart --     Most recent vital signs: Vitals:   12/19/22 1123 12/19/22 1215  BP: (!) 222/107 (!) 186/109  Pulse:    Temp:    SpO2:       General: Awake, no distress.  Alert, talkative. CV:  Good peripheral perfusion.  Resp:  Normal effort.  Abd:  No distention.  Other:  Rectal exam shows a nonthrombosed external hemorrhoid without tenderness on palpation.  No bleeding, no fissure seen.   ED Results / Procedures / Treatments   Labs (all labs ordered are listed, but only abnormal results are displayed) Labs Reviewed - No data to display    PROCEDURES:  Critical Care performed:   Procedures   MEDICATIONS  ORDERED IN ED: Medications  lisinopril (ZESTRIL) tablet 10 mg (10 mg Oral Given 12/19/22 1017)     IMPRESSION / MDM / ASSESSMENT AND PLAN / ED COURSE  I reviewed the triage vital signs and the nursing notes.   Differential diagnosis includes, but is not limited to, external hemorrhoid, fissure, thrombosed hemorrhoid, hypertension uncontrolled, noncompliant medically.  67 year old female presents to the ED for complaint of hemorrhoids.  Patient has been seen 2 other times in the ED for the same complaint.  She has not followed up with surgeon as she was previously instructed.  She also reports that she has not taken her blood pressure medication in 2 years.  I discussed the importance of getting her blood pressure under control.  She recalls her last prescription being lisinopril.  Patient was given lisinopril 10 mg p.o. while in the emergency department.  Initial blood pressure was 228/120 and patient was asymptomatic.  In looking at her previous ED visits her blood pressure has been elevated as well.  Prior to discharge her blood pressure was 186/109.  Patient still had no symptoms and was up ambulatory without any assistance.  She also states that she had a disagreement with her daughter causing her to walk out of the home.  I called and spoke with the daughter at the time of discharge who is coming to pick her mother up.  Patient lives with her and is aware that she is noncompliant.  Daughter was made aware that she does need to see a surgeon for her hemorrhoids and also have a PCP to continue management of her blood pressure.  A prescription for lisinopril 10 mg 1 daily #30 with 3 refills was sent to the pharmacy.      Patient's presentation is most consistent with acute illness / injury with system symptoms.  FINAL CLINICAL IMPRESSION(S) / ED DIAGNOSES   Final diagnoses:  External hemorrhoids  Hypertension, uncontrolled  Medically noncompliant     Rx / DC Orders   ED Discharge  Orders          Ordered    lisinopril (ZESTRIL) 10 MG tablet  Daily        12/19/22 1342             Note:  This document was prepared using Dragon voice recognition software and may include unintentional dictation errors.   Tommi Rumps, PA-C 12/19/22 1433    Trinna Post, MD 12/19/22 [phone]

## 2022-12-19 NOTE — ED Notes (Signed)
Patient wanting to go outside and smoke. Patient was informed that wasn't possible at this time.

## 2022-12-19 NOTE — Discharge Instructions (Addendum)
Call make an appointment with Dr. Maurine Minister who is the surgeon on-call about your hemorrhoids.  You also need a primary care provider to control your blood pressure.  A prescription for lisinopril was sent to the pharmacy with 3 refills.  Call to get a primary care provider either with [facility] or one of the clinics listed on your discharge papers.  It is necessary for you to continue with blood pressure medication to avoid having a heart attack or stroke as your blood pressure was extremely elevated in triage at 228/120.    Please go to the following website to schedule new (and existing) patient appointments:   [email or web address]   The following is a list of primary care offices in the area who are accepting new patients at this time.  Please reach out to one of them directly and let them know you would like to schedule an appointment to follow up on an Emergency Department visit, and/or to establish a new primary care provider (PCP).  There are likely other primary care clinics in the are who are accepting new patients, but this is an excellent place to start:  [facility] Lead physician: Dr Shirlee Latch [address] [place], [place] [address] [phone]  [facility] Lead Physician: Dr Alba Cory [address], [place], [place] [address] [phone]  [facility]  Lead Physician: Dr Olevia Perches [address] [place], [place] [address] [phone]  [facility] Lead Physician: Dr Sofie Hartigan [address] [place], [place] [address] [phone]  [facility] at [facility] Lead Physician: Dr Bari Edward [address] [place], [place], [place] [address] [phone]

## 2022-12-19 NOTE — ED Triage Notes (Signed)
Pt via POV from home. Pt c/o hemorrhoids for "years." States the pain is "really bad" today. States she does have a little bit of bleeding. Pt was seen for same 9/11. Pt is A&Ox4 and NAD  Pt has a hx of HTN but does not take any medication, pt is asymptomatic. States she is suppose to medication but cannot get in with PCP.

## 2023-01-29 ENCOUNTER — Ambulatory Visit: Payer: Medicare PPO | Admitting: General Surgery

## 2023-01-29 ENCOUNTER — Encounter: Payer: Self-pay | Admitting: General Surgery

## 2023-01-29 VITALS — BP 204/120 | HR 98 | Temp 98.7°F | Ht 59.0 in | Wt 104.6 lb

## 2023-01-29 DIAGNOSIS — K642 Third degree hemorrhoids: Secondary | ICD-10-CM | POA: Diagnosis not present

## 2023-01-29 NOTE — Patient Instructions (Signed)
Advised to pursue a goal of 25 to 30 g of fiber daily.  Made aware that the majority of this may be through natural sources, but advised to be aware of actual consumption and to ensure minimal consumption by daily supplementation.  Various forms of supplements discussed.  Recommended Psyllium husk, that mixes well with applesauce, or the powder which goes down well shaken with chocolate milk.  Strongly advised to consume more fluids to ensure adequate hydration, instructed to watch color of urine to determine adequacy of hydration.  Clarity is pursued in urine output, and bowel activity that correlates to significant meal intake.   We need to avoid deferring having bowel movements, advised to take the time at the first sign of sensation, typically following meals, and in the morning.   Subsequent utilization of MiraLAX may be needed ensure at least daily movement, ideally twice daily bowel movements.  If multiple doses of MiraLAX are necessary utilize them. Never skip a day...  To be regular, we must do the above EVERY day. You may also use Metamucil.   Please call a primary care doctor to get your blood pressure under control.   Follow up in 6 weeks.    Hemorrhoids Hemorrhoids are swollen veins that may form: In the butt (rectum). These are called internal hemorrhoids. Around the opening of the butt (anus). These are called external hemorrhoids. Most hemorrhoids do not cause very bad problems. They often get better with changes to your lifestyle and what you eat. What are the causes? Having trouble pooping (constipation) or watery poop (diarrhea). Pushing too hard when you poop. Pregnancy. Being very overweight (obese). Sitting for too long. Riding a bike for a long time. Heavy lifting or other things that take a lot of effort. Anal sex. What are the signs or symptoms? Pain. Itching or soreness in the butt. Bleeding from the butt. Leaking poop. Swelling. One or more lumps around the  opening of your butt. How is this treated? In most cases, hemorrhoids can be treated at home. You may be told to: Change what you eat. Make changes to your lifestyle. If these treatments do not help, you may need to have a procedure done. Your doctor may need to: Place rubber bands at the bottom of the hemorrhoids to make them fall off. Put medicine into the hemorrhoids to shrink them. Shine a type of light on the hemorrhoids to cause them to fall off. Do surgery to get rid of the hemorrhoids. Follow these instructions at home: Medicines Take over-the-counter and prescription medicines only as told by your doctor. Use creams with medicine in them or medicines that you put in your butt as told by your doctor. Eating and drinking  Eat foods that have a lot of fiber in them. These include whole grains, beans, nuts, fruits, and vegetables. Ask your doctor about taking products that have fiber added to them (fibersupplements). Take in less fat. You can do this by: Eating low-fat dairy products. Eating less red meat. Staying away from processed foods. Drink enough fluid to keep your pee (urine) pale yellow. Managing pain and swelling  Take a warm-water bath (sitz bath) for 20 minutes to ease pain. Do this 3-4 times a day. You may do this in a bathtub. You may also use a portable sitz bath that fits over the toilet. If told, put ice on the painful area. It may help to use ice between your warm baths. Put ice in a plastic bag. Place a towel  between your skin and the bag. Leave the ice on for 20 minutes, 2-3 times a day. If your skin turns bright red, take off the ice right away to prevent skin damage. The risk of damage is higher if you cannot feel pain, heat, or cold. General instructions Exercise. Ask your doctor how much and what kind of exercise is best for you. Go to the bathroom when you need to poop. Do not wait. Try not to push too hard when you poop. Keep your butt dry and clean.  Use wet toilet paper or moist towelettes after you poop. Do not sit on the toilet for a long time. Contact a doctor if: You have pain and swelling that do not get better with treatment. You have trouble pooping. You cannot poop. You have pain or swelling outside the area of the hemorrhoids. Get help right away if: You have bleeding from the butt that will not stop. This information is not intended to replace advice given to you by your health care provider. Make sure you discuss any questions you have with your health care provider. Document Revised: 11/15/2021 Document Reviewed: 11/15/2021 Elsevier Patient Education  2024 [employer].

## 2023-02-04 NOTE — Progress Notes (Signed)
Patient ID: Kimberly Fisher, female   DOB: March 02, 1956, 67 y.o.   MRN: [id number] CC: Hemorrhoids  History of Present Illness Kimberly Fisher is a 67 y.o. female with past medical history as below presents for moist.  The patient reports that she has had problems with hemorrhoids for the last 2 years.  However it does begin to worsen over the last 6 months.  She says that in the last 6 months she has noticed bright red blood on her tissue paper.  She also notes that there is prolapse of tissue out of her rectum.  This causes pain.  She has tried using Preparation H to no avail.  She reports that her last colonoscopy was done in 2017 and that she has no and his family history of colon cancers.  She denies any previous rectal intervention and is not currently on any anticoagulation.  Of note, the patient does not have a primary care physician and was recently seen in the emergency department for the same problem.  There she was found to have hypertension and started on antihypertensive medications.  Today in the office she has a systolic pressure in the 200s with a diastolic pressure of 120.Marland Kitchen  Past Medical History Past Medical History:  Diagnosis Date   Anxiety    Asthma    Cardiomegaly    COPD (chronic obstructive pulmonary disease) (HCC)    GERD (gastroesophageal reflux disease)    Hypertension    Low back pain    Mitral valve insufficiency    Nicotine dependence    Obesity        Past Surgical History:  Procedure Laterality Date   CESAREAN SECTION     COLONOSCOPY WITH PROPOFOL N/A 07/18/2015   Procedure: COLONOSCOPY WITH PROPOFOL;  Surgeon: Elnita Maxwell, MD;  Location[facility] ENDOSCOPY;  Service: Endoscopy;  Laterality: N/A;   ESOPHAGOGASTRODUODENOSCOPY (EGD) WITH PROPOFOL N/A 07/18/2015   Procedure: ESOPHAGOGASTRODUODENOSCOPY (EGD) WITH PROPOFOL;  Surgeon: Elnita Maxwell, MD;  Location: [facility] ENDOSCOPY;  Service: Endoscopy;  Laterality: N/A;   Essential Tubal Ligation      TONSILLECTOMY      Allergies  Allergen Reactions   Sulfa Antibiotics Nausea And Vomiting    Current Outpatient Medications  Medication Sig Dispense Refill   amLODipine (NORVASC) 5 MG tablet Take 1 tablet (5 mg total) by mouth daily. 30 tablet 2   aspirin EC 81 MG EC tablet Take 1 tablet (81 mg total) by mouth daily. Swallow whole. 30 tablet 11   hydrocortisone-pramoxine (PROCTOFOAM-HC) rectal foam Place 1 applicator rectally 2 (two) times daily. 10 g 1   lisinopril (ZESTRIL) 10 MG tablet Take 1 tablet (10 mg total) by mouth daily. 30 tablet 3   loperamide (IMODIUM A-D) 2 MG tablet Take 1 tablet (2 mg total) by mouth 4 (four) times daily as needed for diarrhea or loose stools. 30 tablet 0   nicotine (NICODERM CQ - DOSED IN MG/24 HOURS) 21 mg/24hr patch Place 1 patch (21 mg total) onto the skin daily. 28 patch 0   phenylephrine-shark liver oil-mineral oil-petrolatum (PREPARATION H) 0.25-14-74.9 % rectal ointment Place 1 Application rectally 2 (two) times daily as needed for hemorrhoids. 28 g 0   albuterol (VENTOLIN HFA) 108 (90 Base) MCG/ACT inhaler Inhale 1-2 puffs into the lungs every 4 (four) hours as needed for wheezing or shortness of breath. 1 each 0   atorvastatin (LIPITOR) 40 MG tablet Take 1 tablet (40 mg total) by mouth daily. 30 tablet 0   budesonide-formoterol (SYMBICORT)  160-4.5 MCG/ACT inhaler Inhale 2 puffs into the lungs 4 (four) times daily. 1 each 0   No current facility-administered medications for this visit.    Family History History reviewed. No pertinent family history.     Social History Social History   Tobacco Use   Smoking status: Every Day    Current packs/day: 1.00    Types: Cigarettes   Smokeless tobacco: Never  Substance Use Topics   Alcohol use: No   Drug use: No        ROS Full ROS of systems performed and is otherwise negative there than what is stated in the HPI  Physical Exam Blood pressure (!) 204/120, pulse 98, temperature 98.7 F  (37.1 C), temperature source Oral, height 4\' 11"  (1.499 m), weight 104 lb 9.6 oz (47.4 kg), SpO2 97%.  Acute distress, PERRLA, moving all extremities spontaneously.  Abdomen soft, nondistended and nontender.  Anorectal exam performed in the presence of a chaperone.  On visual inspection that she has a right anterior external hemorrhoid.  On digital rectal exam she has slightly decreased rectal tone at rest.  There are no dominant masses or lesions noted.  She had no gross blood.  Anoscopy performed and she has grade 3 internal hemorrhoids worse at the right anterior position.  Data Reviewed I reviewed her recent ED visit and her health records.  She has hypertension and is only just started on therapy for that  I have personally reviewed the patient's imaging and medical records.    Assessment    Ms. Kimberly Fisher is a 67 year old female with grade 3 hemorrhoids.  Plan    I discussed with the patient that given that she is having bleeding and pain from her hemorrhoids that I think she would be a good candidate for hemorrhoidectomy.  However, she is extremely hypertensive into the systolics of 200s.  She does not have a primary care physician and has just recently started on antihypertensive medications.  I recommended that we increase her fiber intake as she does report some constipation.  I have also instructed her to see a PCP and we will see her back in 6 weeks and if her blood pressure is better then we can discuss hemorrhoidectomy.    [name] 02/04/2023, 3:42 PM

## 2023-03-05 ENCOUNTER — Ambulatory Visit: Payer: Medicare PPO | Admitting: General Surgery

## 2023-06-11 IMAGING — CT CT HEAD W/O CM
3 series · 15 of 45 positions shown, 18 images · non-contrast
Comparison: None.

CLINICAL DATA: 64-year-old female with right eye double vision and
eyelid droop since 4344 hours. Unable to walk straight.

EXAM:
CT HEAD WITHOUT CONTRAST
TECHNIQUE: Contiguous axial images were obtained from the base of the skull
through the vertex without intravenous contrast.

[Series 3: head wo · axial · 0.40mm/px · z∈[-109,+6]mm · 9 of 28 slices shown, 12 images]
[im 3/28  brain]
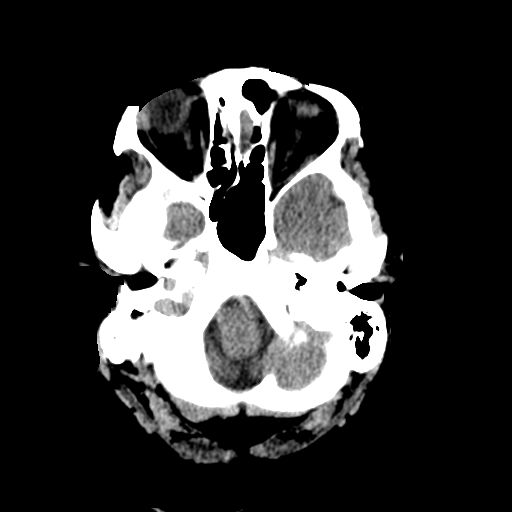
[im 3/28  bone]
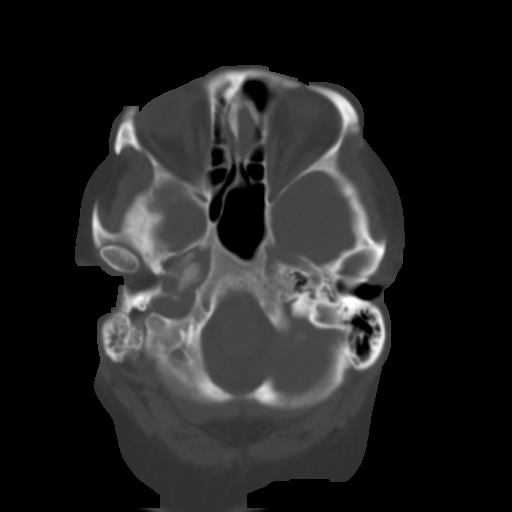
[im 6/28  brain]
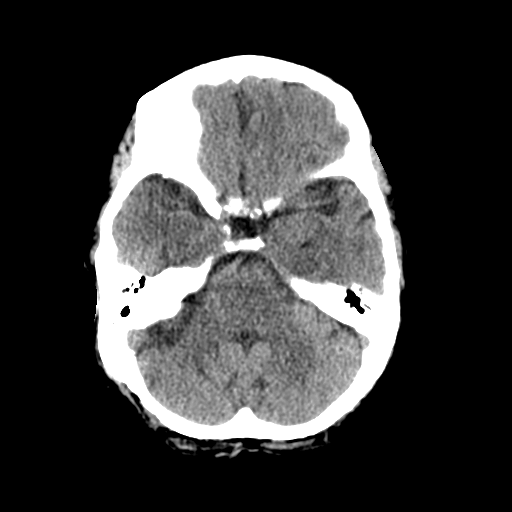
[im 9/28  brain]
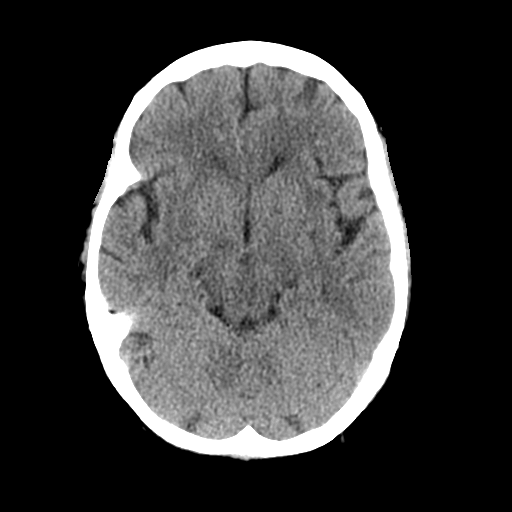
[im 12/28  brain]
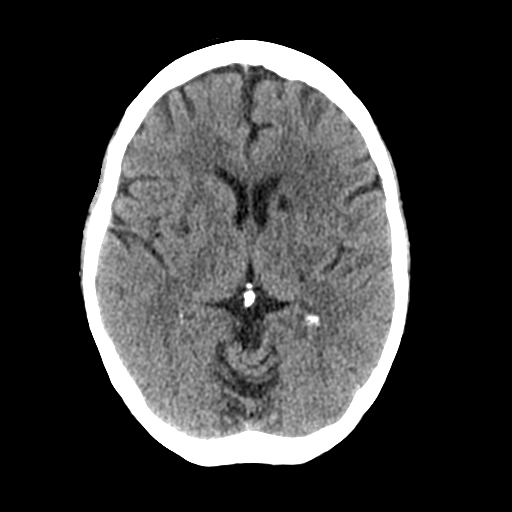
[im 15/28  brain]
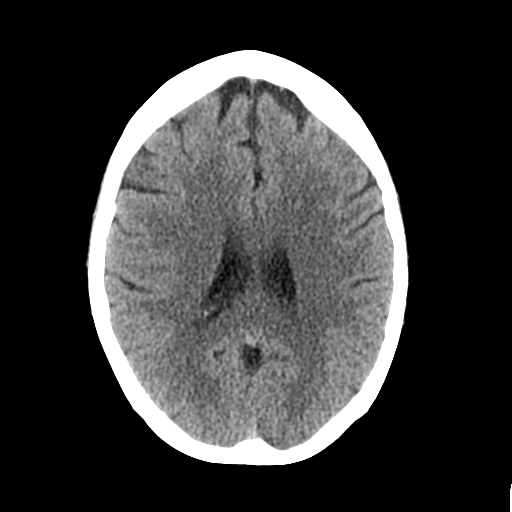
[im 15/28  bone]
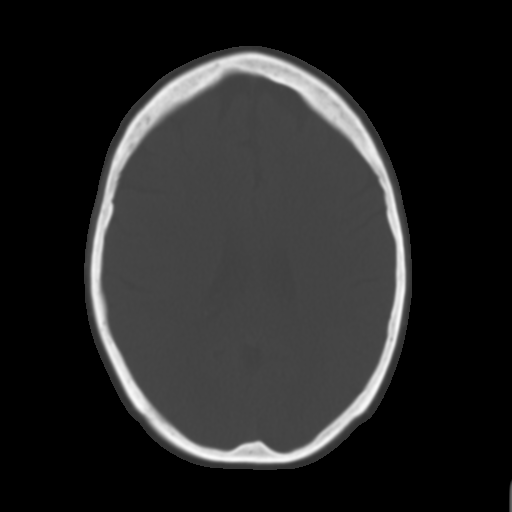
[im 17/28  brain]
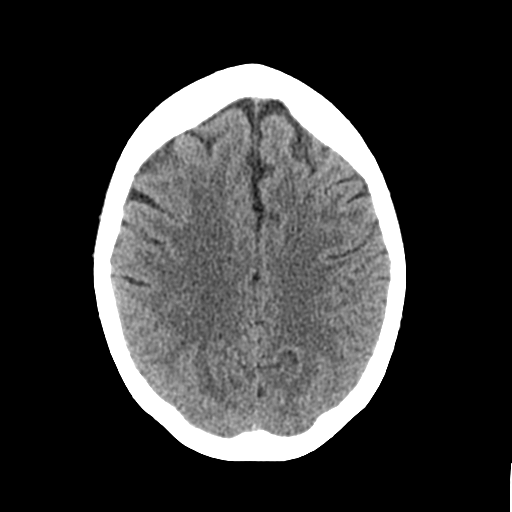
[im 20/28  brain]
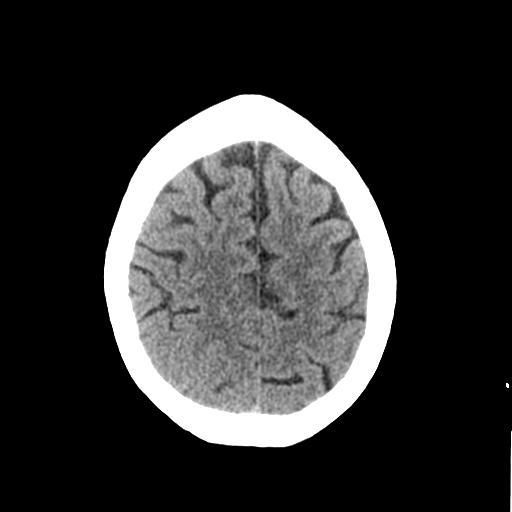
[im 23/28  brain]
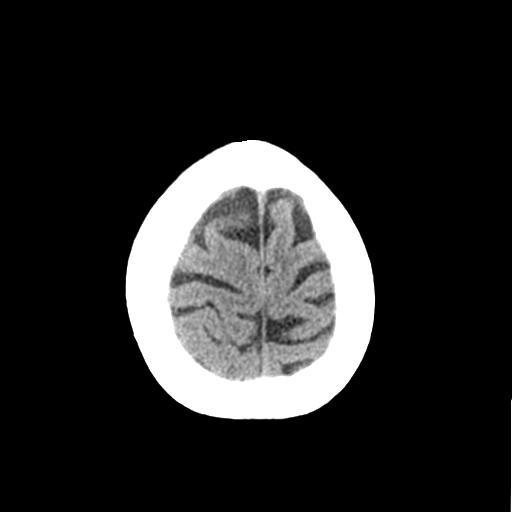
[im 26/28  brain]
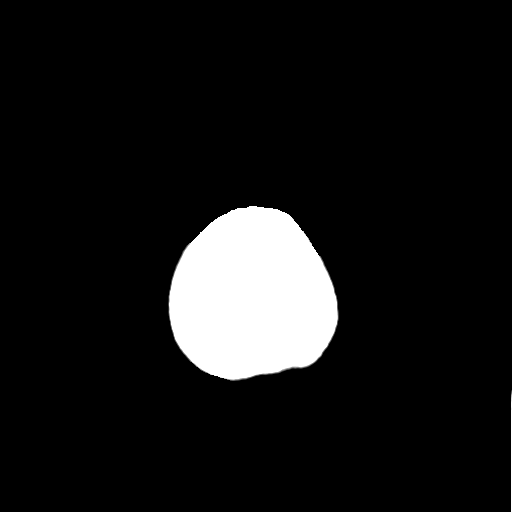
[im 26/28  bone]
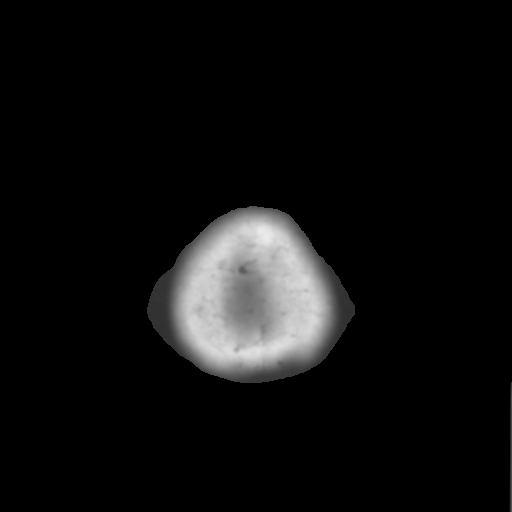

[Series 4: coronal soft tissue · coronal · 0.30mm/px · 3 of 60 slices shown]
[im 20/60  brain]
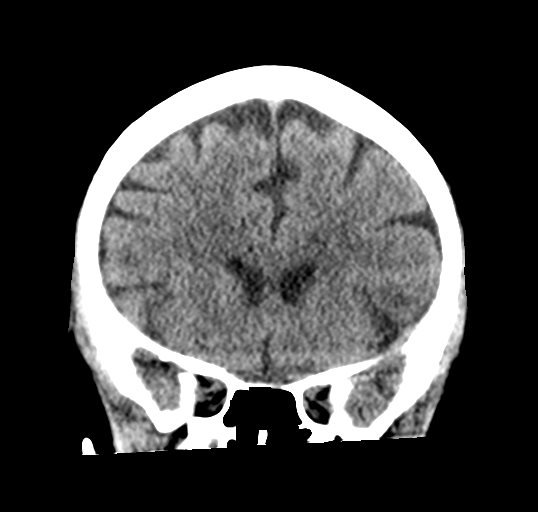
[im 27/60  brain]
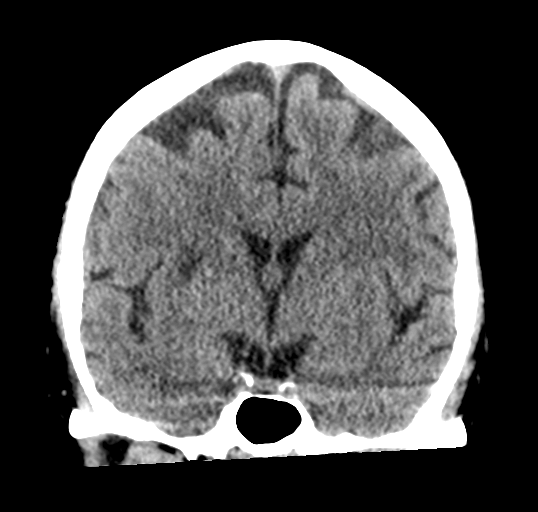
[im 33/60  brain]
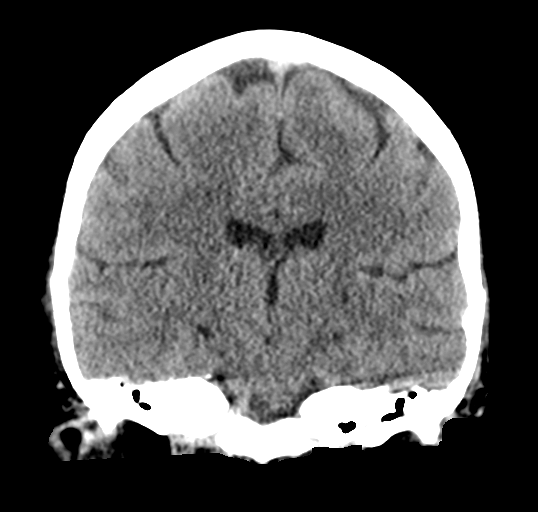

[Series 5: sagittal soft tissue · sagittal · 0.30mm/px · 3 of 51 slices shown]
[im 17/51  brain]
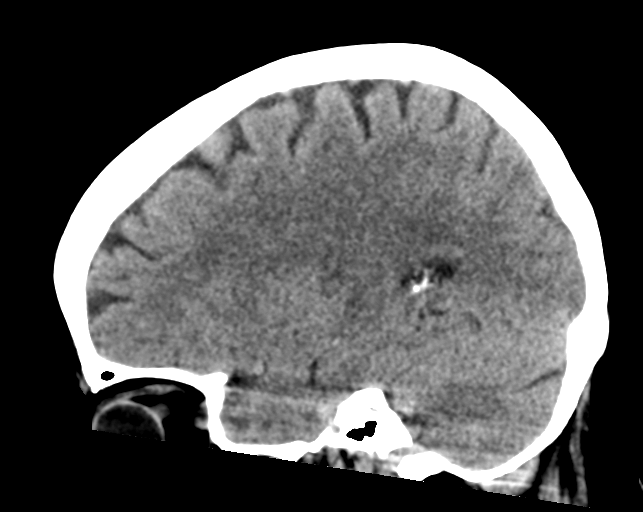
[im 26/51  brain]
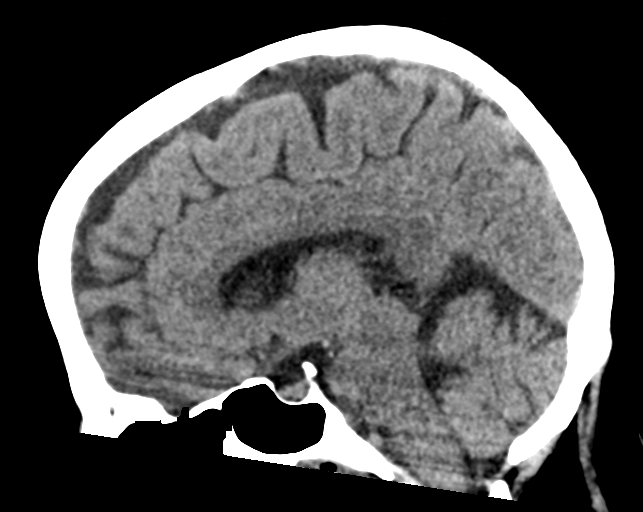
[im 34/51  brain]
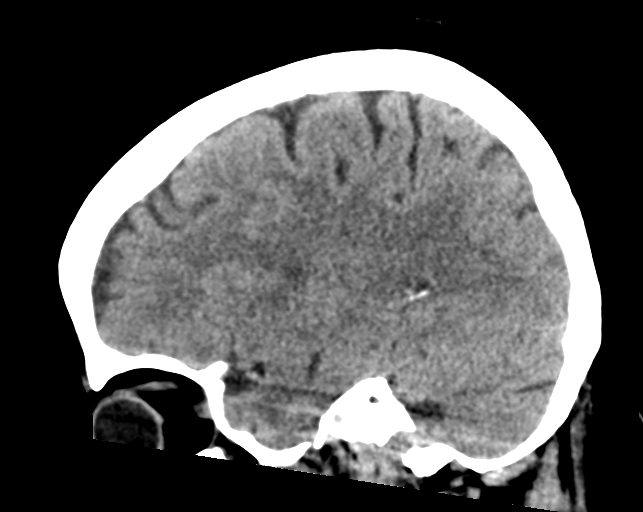

[15 of 45 positions shown; findings below may reference images not displayed]

FINDINGS: Brain: Chronic appearing lacunar infarct left basal ganglia. More
age indeterminate appearance of right lentiform hypodensity on
series 3, image 13.

Additionally, subtle evidence of asymmetric hypodensity in the left
pons (coronal image 36).

No superimposed midline shift, ventriculomegaly, mass effect,
evidence of mass lesion, intracranial hemorrhage or evidence of
cortically based acute infarction. No chronic cortical
encephalomalacia identified.

Vascular: Calcified atherosclerosis at the skull base. No suspicious
intracranial vascular hyperdensity.

Skull: No acute osseous abnormality identified.

Sinuses/Orbits: Partially visible chronic right maxillary sinus
mucoperiosteal thickening. Bilateral ethmoid mucosal thickening with
some bubbly opacity.

Left tympanic cavity and mastoids are well aerated. There is partial
opacification of both the right tympanic cavity and the right
mastoid air cells. No bone erosion is evident.

Other: Probable Disconjugate gaze but otherwise negative visible
orbits soft tissues. Visualized scalp soft tissues are within normal
limits.
IMPRESSION: 1. Age indeterminate small vessel ischemia in the right lentiform
nucleus and possibly also the left pons.
No associated hemorrhage or mass effect. Chronic appearing lacunar
infarct in the left basal ganglia.

2. Right middle ear and mastoid opacification. Consider otitis
media.

3. Acute and chronic appearing paranasal sinus disease.

## 2023-06-11 IMAGING — MR MR HEAD W/O CM
13 series · 45 of 48 positions shown · non-contrast
Comparison: Prior head CT examinations 10/03/2020 and earlier

CLINICAL DATA: Neuro deficit, acute, stroke suspected. Additional
history provided: Patient reports right eye double vision and droop
of eyelid, patient reports symptoms began at 3 a.m. this morning,
left arm drift, patient unable to walk.

EXAM:
MRI HEAD WITHOUT CONTRAST
TECHNIQUE: Multiplanar, multiecho pulse sequences of the brain and surrounding
structures were obtained without intravenous contrast.

[Series 5: ax dwi_tracew · axial · 3.0mm · 0.65mm/px · z∈[-74,+77]mm · 2 of 48 slices shown]
[im 1/48]
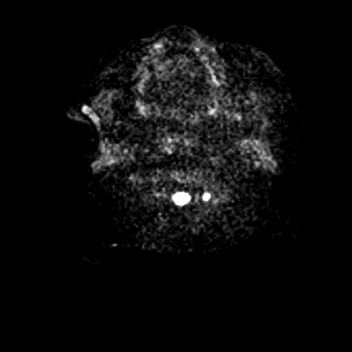
[im 48/48]
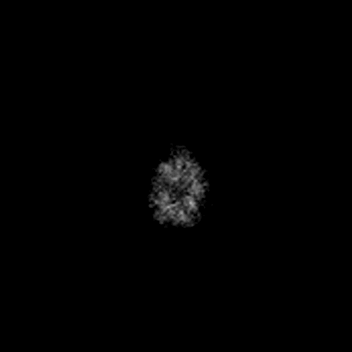

[Series 6: ax dwi_adc · axial · 3.0mm · 0.65mm/px · z∈[-74,+77]mm · 3 of 48 slices shown]
[im 1/48]
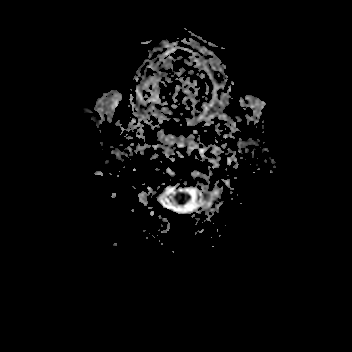
[im 24/48]
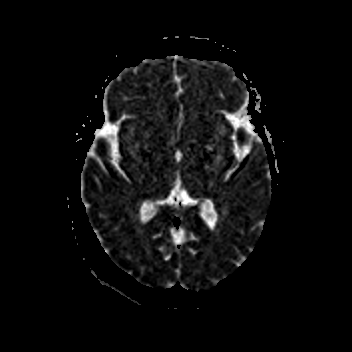
[im 48/48]
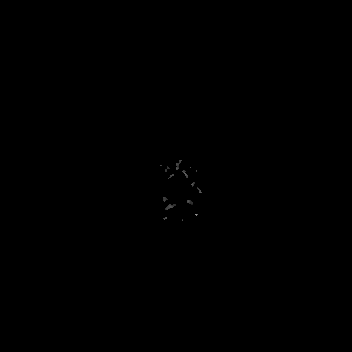

[Series 7: cor dwi_tracew · coronal · 5.0mm · 0.65mm/px · 3 of 40 slices shown]
[im 1/40]
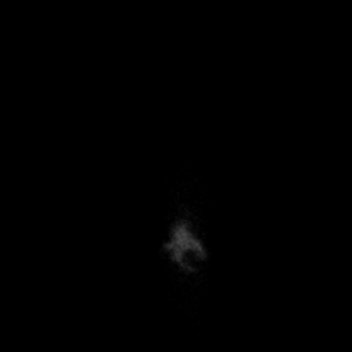
[im 20/40]
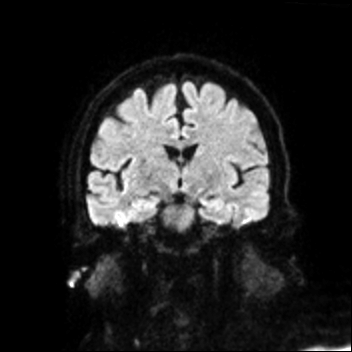
[im 40/40]
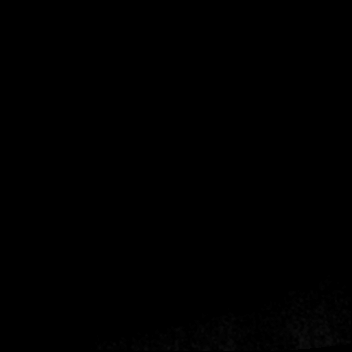

[Series 8: cor dwi_adc · coronal · 5.0mm · 0.65mm/px · 2 of 37 slices shown]
[im 1/37]
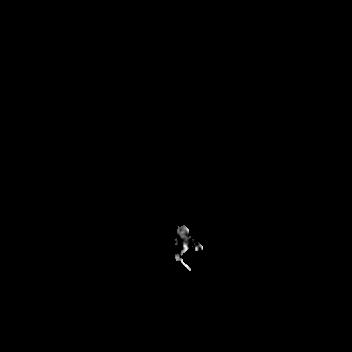
[im 37/37]
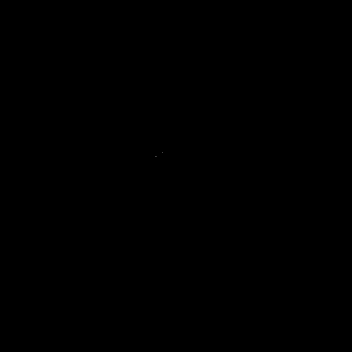

[Series 9: T1 · sagittal · 5.0mm · 0.62mm/px · 2 of 25 slices shown (1 of 2)]
[im 1/25]
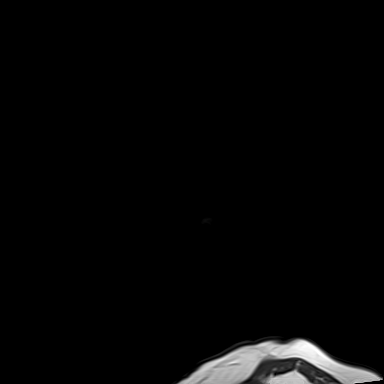
[im 25/25]
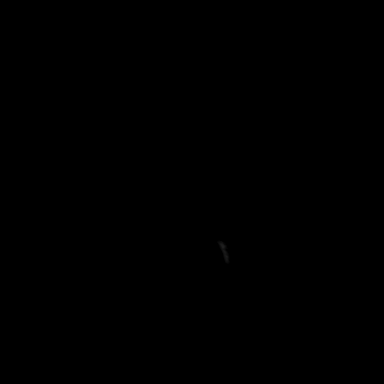

[Series 10: T2 · axial · 5.0mm · 0.53mm/px · z∈[-69,+71]mm · 2 of 25 slices shown (1 of 2)]
[im 1/25]
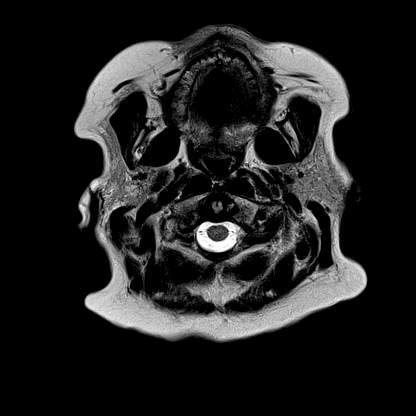
[im 25/25]
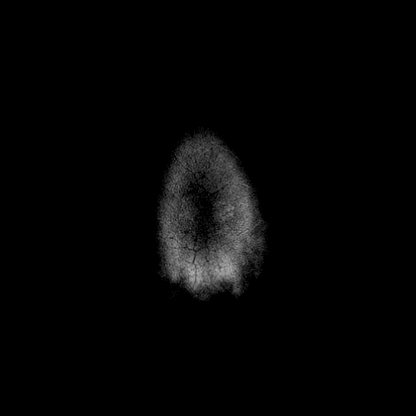

[Series 11: mag_images · axial · 3.0mm · 0.90mm/px · z∈[-87,+85]mm · 4 of 60 slices shown]
[im 1/60]
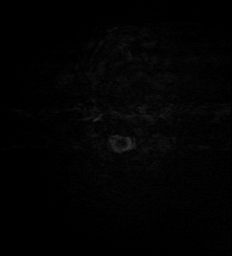
[im 20/60]
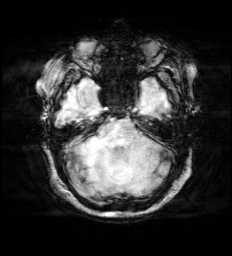
[im 40/60]
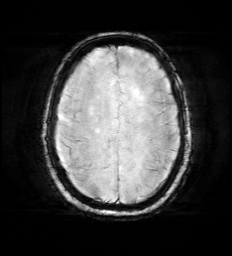
[im 60/60]
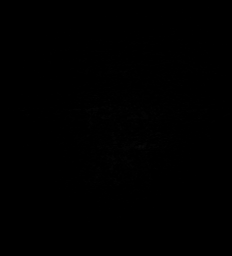

[Series 12: pha_images · axial · 3.0mm · 0.90mm/px · z∈[-87,+85]mm · 4 of 58 slices shown]
[im 1/58]
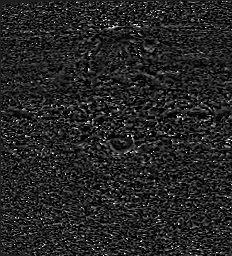
[im 20/58]
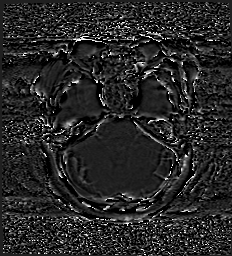
[im 39/58]
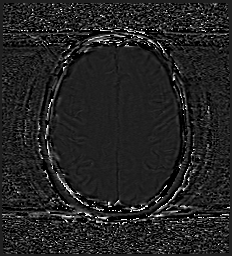
[im 58/58]
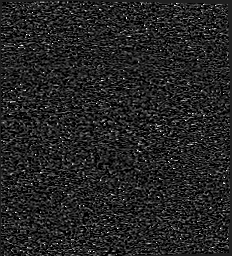

[Series 13: swi_images · axial · 3.0mm · 0.90mm/px · z∈[-87,+85]mm · 4 of 60 slices shown]
[im 1/60]
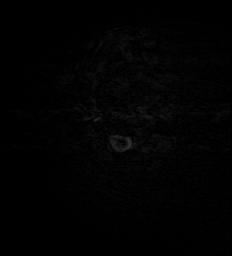
[im 20/60]
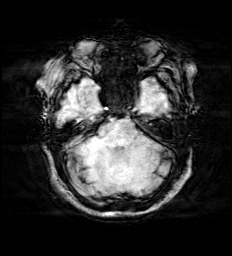
[im 40/60]
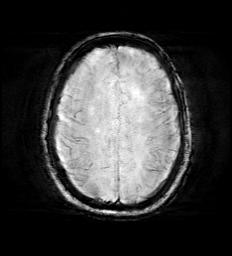
[im 60/60]
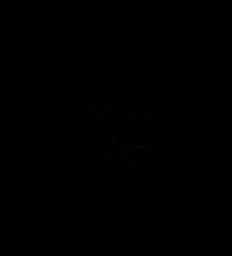

[Series 14: mip_images(sw) · axial · 24.0mm · 0.90mm/px · z∈[-77,+75]mm · 4 of 53 slices shown]
[im 1/53]
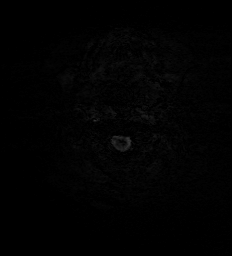
[im 18/53]
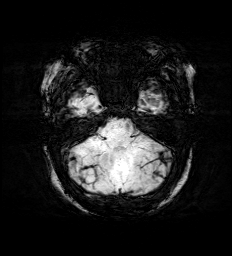
[im 35/53]
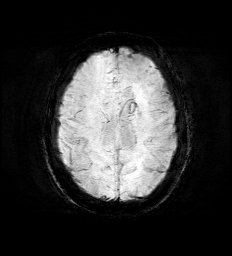
[im 53/53]
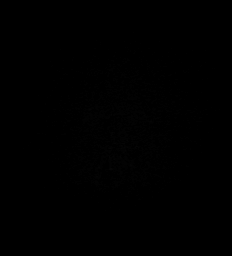

[Series 15: FLAIR · axial · 3.0mm · 0.53mm/px · z∈[-78,+79]mm · 4 of 55 slices shown]
[im 1/55]
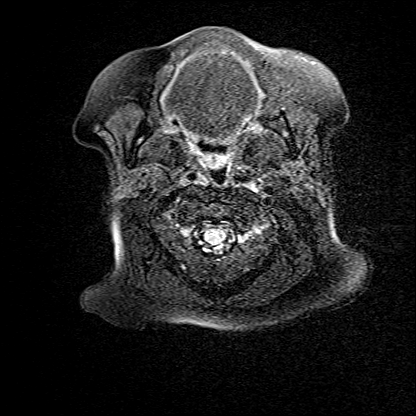
[im 19/55]
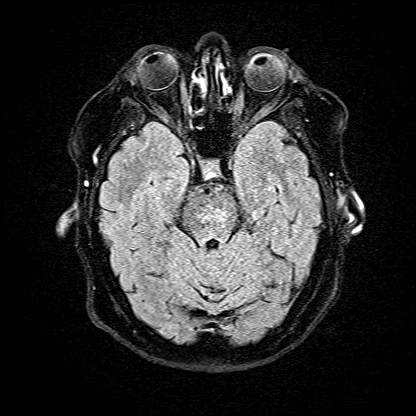
[im 37/55]
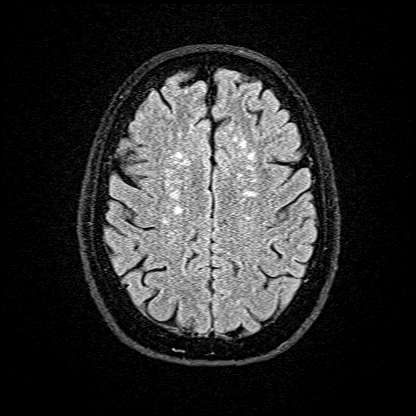
[im 55/55]
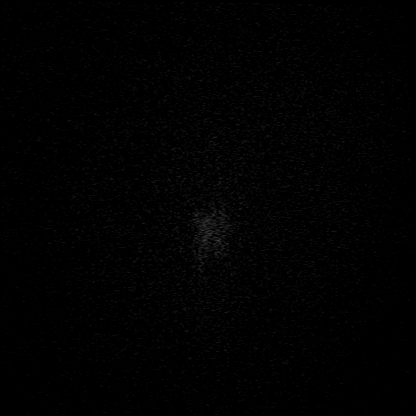

[Series 16: T1 · axial · 1.0mm · 0.98mm/px · z∈[-84,+86]mm · 9 of 176 slices shown (2 of 2)]
[im 1/176]
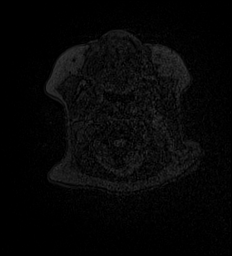
[im 16/176]
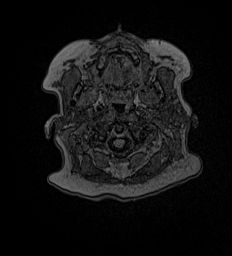
[im 32/176]
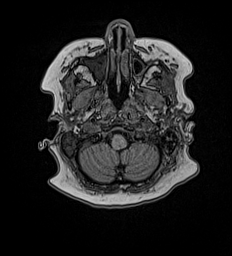
[im 48/176]
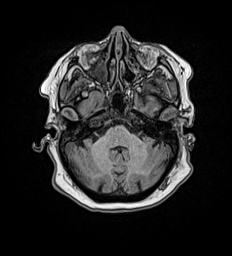
[im 80/176]
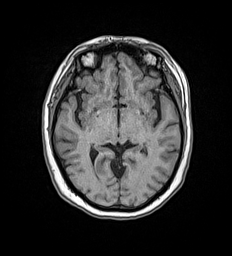
[im 96/176]
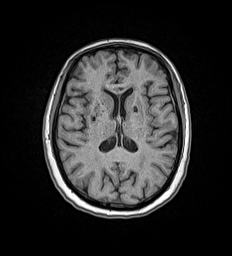
[im 128/176]
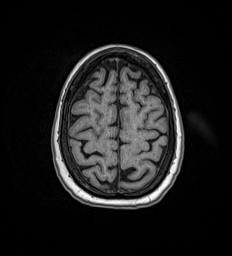
[im 144/176]
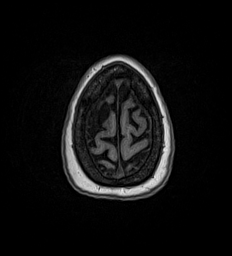
[im 176/176]
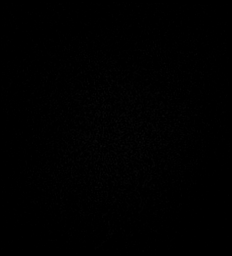

[Series 17: T2 · coronal · 5.0mm · 0.45mm/px · 2 of 31 slices shown (2 of 2)]
[im 1/31]
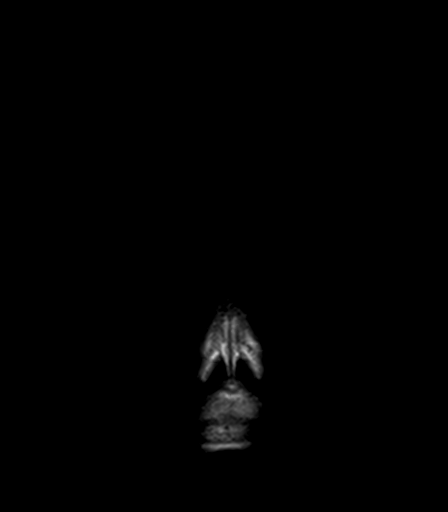
[im 31/31]
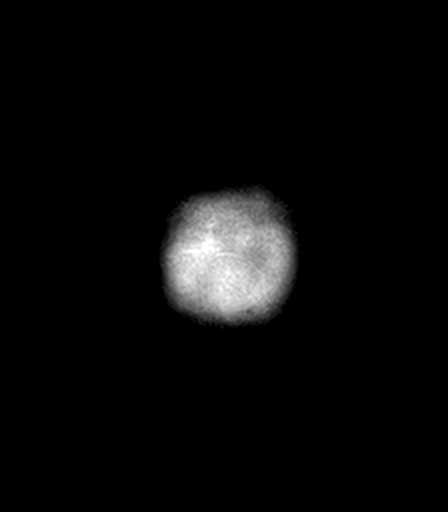

[45 of 48 positions shown; findings below may reference images not displayed]

FINDINGS: Brain:

Mild generalized cerebral and cerebellar atrophy.

Chronic lacunar infarcts within bilateral basal ganglia, posterior
limb of left internal capsule, left thalamus and left pons. Chronic
hemosiderin deposition associated with a chronic lacunar infarct
within the left basal ganglia.

Background mild-to-moderate multifocal T2/FLAIR hyperintensity
within the cerebral white matter and pons, nonspecific but
compatible with chronic small vessel ischemic disease.

No evidence of acute infarction.

No evidence of an intracranial mass.

No extra-axial fluid collection.

No midline shift.

Vascular: Expected proximal arterial flow voids.

Skull and upper cervical spine: No focal marrow lesion.

Sinuses/Orbits: Visualized orbits show no acute finding. Frothy
secretions and severe mucosal thickening within the right maxillary
sinus. Mild mucosal thickening within the left sphenoid sinus.
Frothy secretions and mild mucosal thickening within the right
ethmoid sinuses. Mild mucosal thickening within the left ethmoid
sinuses. Trace mucosal thickening within the frontal sinuses.

Other: Large right middle ear/mastoid effusion. A left mastoid
effusion is also present.
IMPRESSION: No evidence of acute intracranial abnormality.

Chronic lacunar infarcts within the bilateral basal ganglia,
posterior limb of left internal capsule, left thalamus and left
pons.

Background mild-to-moderate chronic small vessel ischemic disease
within the cerebral white matter and pons.

Mild generalized cerebral atrophy.

Paranasal sinus disease, as described.

Large right middle ear/mastoid effusion. A left mastoid effusion is
also present.

## 2023-10-28 ENCOUNTER — Emergency Department

## 2023-10-28 ENCOUNTER — Encounter: Payer: Self-pay | Admitting: Emergency Medicine

## 2023-10-28 ENCOUNTER — Inpatient Hospital Stay
Admission: EM | Admit: 2023-10-28 | Discharge: 2023-11-02 | DRG: 280 | Disposition: A | Discharge status: Home / Self Care | Attending: Student | Admitting: Student

## 2023-10-28 ENCOUNTER — Other Ambulatory Visit: Payer: Self-pay

## 2023-10-28 DIAGNOSIS — J441 Chronic obstructive pulmonary disease with (acute) exacerbation: Principal | ICD-10-CM | POA: Diagnosis present

## 2023-10-28 DIAGNOSIS — R0602 Shortness of breath: Secondary | ICD-10-CM

## 2023-10-28 DIAGNOSIS — I130 Hypertensive heart and chronic kidney disease with heart failure and stage 1 through stage 4 chronic kidney disease, or unspecified chronic kidney disease: Principal | ICD-10-CM | POA: Diagnosis present

## 2023-10-28 DIAGNOSIS — E669 Obesity, unspecified: Secondary | ICD-10-CM | POA: Diagnosis present

## 2023-10-28 DIAGNOSIS — N179 Acute kidney failure, unspecified: Secondary | ICD-10-CM | POA: Diagnosis present

## 2023-10-28 DIAGNOSIS — K649 Unspecified hemorrhoids: Secondary | ICD-10-CM | POA: Diagnosis present

## 2023-10-28 DIAGNOSIS — I509 Heart failure, unspecified: Secondary | ICD-10-CM

## 2023-10-28 DIAGNOSIS — F419 Anxiety disorder, unspecified: Secondary | ICD-10-CM | POA: Diagnosis present

## 2023-10-28 DIAGNOSIS — Z91148 Patient's other noncompliance with medication regimen for other reason: Secondary | ICD-10-CM

## 2023-10-28 DIAGNOSIS — K5900 Constipation, unspecified: Secondary | ICD-10-CM | POA: Diagnosis present

## 2023-10-28 DIAGNOSIS — I2489 Other forms of acute ischemic heart disease: Secondary | ICD-10-CM | POA: Diagnosis present

## 2023-10-28 DIAGNOSIS — I5031 Acute diastolic (congestive) heart failure: Secondary | ICD-10-CM | POA: Diagnosis present

## 2023-10-28 DIAGNOSIS — J90 Pleural effusion, not elsewhere classified: Secondary | ICD-10-CM | POA: Diagnosis present

## 2023-10-28 DIAGNOSIS — R0902 Hypoxemia: Secondary | ICD-10-CM

## 2023-10-28 DIAGNOSIS — J9601 Acute respiratory failure with hypoxia: Principal | ICD-10-CM | POA: Diagnosis present

## 2023-10-28 DIAGNOSIS — R230 Cyanosis: Secondary | ICD-10-CM | POA: Diagnosis present

## 2023-10-28 DIAGNOSIS — Z79899 Other long term (current) drug therapy: Secondary | ICD-10-CM

## 2023-10-28 DIAGNOSIS — D631 Anemia in chronic kidney disease: Secondary | ICD-10-CM | POA: Diagnosis present

## 2023-10-28 DIAGNOSIS — R7989 Other specified abnormal findings of blood chemistry: Secondary | ICD-10-CM

## 2023-10-28 DIAGNOSIS — Z7951 Long term (current) use of inhaled steroids: Secondary | ICD-10-CM

## 2023-10-28 DIAGNOSIS — T502X5A Adverse effect of carbonic-anhydrase inhibitors, benzothiadiazides and other diuretics, initial encounter: Secondary | ICD-10-CM | POA: Diagnosis present

## 2023-10-28 DIAGNOSIS — R140 Abdominal distension (gaseous): Secondary | ICD-10-CM | POA: Diagnosis present

## 2023-10-28 DIAGNOSIS — E1122 Type 2 diabetes mellitus with diabetic chronic kidney disease: Secondary | ICD-10-CM | POA: Diagnosis present

## 2023-10-28 DIAGNOSIS — I21A1 Myocardial infarction type 2: Secondary | ICD-10-CM | POA: Diagnosis present

## 2023-10-28 DIAGNOSIS — Z7982 Long term (current) use of aspirin: Secondary | ICD-10-CM

## 2023-10-28 DIAGNOSIS — R339 Retention of urine, unspecified: Secondary | ICD-10-CM | POA: Diagnosis present

## 2023-10-28 DIAGNOSIS — Z8673 Personal history of transient ischemic attack (TIA), and cerebral infarction without residual deficits: Secondary | ICD-10-CM

## 2023-10-28 DIAGNOSIS — N1831 Chronic kidney disease, stage 3a: Secondary | ICD-10-CM | POA: Diagnosis present

## 2023-10-28 DIAGNOSIS — E785 Hyperlipidemia, unspecified: Secondary | ICD-10-CM | POA: Diagnosis present

## 2023-10-28 DIAGNOSIS — Z8249 Family history of ischemic heart disease and other diseases of the circulatory system: Secondary | ICD-10-CM

## 2023-10-28 DIAGNOSIS — I4719 Other supraventricular tachycardia: Secondary | ICD-10-CM | POA: Diagnosis present

## 2023-10-28 DIAGNOSIS — E1165 Type 2 diabetes mellitus with hyperglycemia: Secondary | ICD-10-CM | POA: Diagnosis present

## 2023-10-28 DIAGNOSIS — Z6821 Body mass index (BMI) 21.0-21.9, adult: Secondary | ICD-10-CM

## 2023-10-28 DIAGNOSIS — F17210 Nicotine dependence, cigarettes, uncomplicated: Secondary | ICD-10-CM | POA: Diagnosis present

## 2023-10-28 DIAGNOSIS — I160 Hypertensive urgency: Secondary | ICD-10-CM | POA: Diagnosis present

## 2023-10-28 DIAGNOSIS — R739 Hyperglycemia, unspecified: Secondary | ICD-10-CM

## 2023-10-28 LAB — CBC WITH DIFFERENTIAL/PLATELET
Abs Immature Granulocytes: 0.08 K/uL — ABNORMAL HIGH (ref 0.00–0.07)
Basophils Absolute: 0.1 K/uL (ref 0.0–0.1)
Basophils Relative: 1 %
Eosinophils Absolute: 0.3 K/uL (ref 0.0–0.5)
Eosinophils Relative: 4 %
HCT: 38.6 % (ref 36.0–46.0)
Hemoglobin: 12.3 g/dL (ref 12.0–15.0)
Immature Granulocytes: 1 %
Lymphocytes Relative: 23 %
Lymphs Abs: 1.8 K/uL (ref 0.7–4.0)
MCH: 29.6 pg (ref 26.0–34.0)
MCHC: 31.9 g/dL (ref 30.0–36.0)
MCV: 92.8 fL (ref 80.0–100.0)
Monocytes Absolute: 0.5 K/uL (ref 0.1–1.0)
Monocytes Relative: 7 %
Neutro Abs: 5 K/uL (ref 1.7–7.7)
Neutrophils Relative %: 64 %
Platelets: 271 K/uL (ref 150–400)
RBC: 4.16 MIL/uL (ref 3.87–5.11)
RDW: 13.3 % (ref 11.5–15.5)
WBC: 7.7 K/uL (ref 4.0–10.5)
nRBC: 0 % (ref 0.0–0.2)

## 2023-10-28 LAB — COMPREHENSIVE METABOLIC PANEL WITH GFR
ALT: 27 U/L (ref 0–44)
AST: 45 U/L — ABNORMAL HIGH (ref 15–41)
Albumin: 3.2 g/dL — ABNORMAL LOW (ref 3.5–5.0)
Alkaline Phosphatase: 78 U/L (ref 38–126)
Anion gap: 10 (ref 5–15)
BUN: 22 mg/dL (ref 8–23)
CO2: 21 mmol/L — ABNORMAL LOW (ref 22–32)
Calcium: 8.5 mg/dL — ABNORMAL LOW (ref 8.9–10.3)
Chloride: 109 mmol/L (ref 98–111)
Creatinine, Ser: 1.15 mg/dL — ABNORMAL HIGH (ref 0.44–1.00)
GFR, Estimated: 53 mL/min — ABNORMAL LOW (ref >60)
Glucose, Bld: 221 mg/dL — ABNORMAL HIGH (ref 70–99)
Potassium: 3.8 mmol/L (ref 3.5–5.1)
Sodium: 140 mmol/L (ref 135–145)
Total Bilirubin: 0.4 mg/dL (ref 0.0–1.2)
Total Protein: 6.5 g/dL (ref 6.5–8.1)

## 2023-10-28 LAB — TROPONIN I (HIGH SENSITIVITY): Troponin I (High Sensitivity): 50 ng/L — ABNORMAL HIGH (ref <18)

## 2023-10-28 LAB — MAGNESIUM: Magnesium: 3.8 mg/dL — ABNORMAL HIGH (ref 1.7–2.4)

## 2023-10-28 MED ORDER — IPRATROPIUM-ALBUTEROL 0.5-2.5 (3) MG/3ML IN SOLN
6.0000 mL | Freq: Once | RESPIRATORY_TRACT | Status: AC
  Administered 2023-10-28 (×2): 6 mL via RESPIRATORY_TRACT
  Filled 2023-10-28: qty 6

## 2023-10-28 MED ORDER — METHYLPREDNISOLONE SODIUM SUCC 125 MG IJ SOLR
125.0000 mg | Freq: Once | INTRAMUSCULAR | Status: AC
  Administered 2023-10-28 (×2): 125 mg via INTRAVENOUS
  Filled 2023-10-28: qty 2

## 2023-10-28 NOTE — ED Triage Notes (Signed)
 Pt arrived via [facility] from home with initial respiratory distress. Per fire department, pt initial oxygen saturation on room air was 32%. EMS placed pt on CPAP, administered 2g Mag, 1 Albuterol  and 1 duo-neb. Hx/o CPOD.

## 2023-10-28 NOTE — ED Notes (Signed)
 CCMD called to initiate cardiac monitoring.

## 2023-10-28 NOTE — ED Provider Notes (Signed)
 [facility] Provider Note    Event Date/Time   First MD Initiated Contact with Patient 10/28/23 2309     (approximate)   History   Shortness of Breath   HPI  Kimberly Fisher is a 68 y.o. female who presents to the ED for evaluation of Shortness of Breath   Reviewed general surgery clinic visit from last year.  Seen for hemorrhoids.  History of COPD, HTN, continued cigarette smoking.  Patient presents to the ED from home via EMS for evaluation of shortness of breath over the past 1-2 days.  She was found hypoxic and cyanotic, improving dramatically with EMS CPAP, IV magnesium and albuterol .   Physical Exam   Triage Vital Signs: ED Triage Vitals  Encounter Vitals Group     BP 10/28/23 2308 (!) 231/119     Girls Systolic BP Percentile --      Girls Diastolic BP Percentile --      Boys Systolic BP Percentile --      Boys Diastolic BP Percentile --      Pulse Rate 10/28/23 2308 (!) 103     Resp 10/28/23 2315 16     Temp 10/28/23 2318 97.7 F (36.5 C)     Temp Source 10/28/23 2318 Axillary     SpO2 10/28/23 2306 98 %     Weight --      Height --      Head Circumference --      Peak Flow --      Pain Score --      Pain Loc --      Pain Education --      Exclude from Growth Chart --     Most recent vital signs: Vitals:   10/29/23 0030 10/29/23 0045  BP: (!) 209/114   Pulse: (!) 101 100  Resp: (!) 26 (!) 21  Temp:    SpO2: 100% 100%    General: Awake, no distress.  Sitting upright, no distress or tripoding CV:  Good peripheral perfusion.  Resp:  Tachypnea without distress.  Poor airflow and wheezing throughout. Abd:  No distention.  Soft MSK:  No deformity noted.  Neuro:  No focal deficits appreciated. Other:     ED Results / Procedures / Treatments   Labs (all labs ordered are listed, but only abnormal results are displayed) Labs Reviewed  COMPREHENSIVE METABOLIC PANEL WITH GFR - Abnormal; Notable for the following components:       Result Value   CO2 21 (*)    Glucose, Bld 221 (*)    Creatinine, Ser 1.15 (*)    Calcium  8.5 (*)    Albumin 3.2 (*)    AST 45 (*)    GFR, Estimated 53 (*)    All other components within normal limits  BRAIN NATRIURETIC PEPTIDE - Abnormal; Notable for the following components:   B Natriuretic Peptide 900.3 (*)    All other components within normal limits  CBC WITH DIFFERENTIAL/PLATELET - Abnormal; Notable for the following components:   Abs Immature Granulocytes 0.08 (*)    All other components within normal limits  MAGNESIUM - Abnormal; Notable for the following components:   Magnesium 3.8 (*)    All other components within normal limits  TROPONIN I (HIGH SENSITIVITY) - Abnormal; Notable for the following components:   Troponin I (High Sensitivity) 50 (*)    All other components within normal limits  URINALYSIS, ROUTINE W REFLEX MICROSCOPIC  TROPONIN I (HIGH SENSITIVITY)  EKG Sinus tachycardia with a rate of 105 bpm.  Normal axis and intervals.  Nonspecific changes without STEMI.  RADIOLOGY 1 view CXR interpreted by me with pulmonary vascular congestion without discrete lobar infiltration  Official radiology report(s): DG Chest Portable 1 View Result Date: 10/29/2023 CLINICAL DATA:  COPD exacerbation EXAM: PORTABLE CHEST 1 VIEW COMPARISON:  Seventy teen 22 FINDINGS: Lung volumes are small. Superimposed mild diffuse interstitial pulmonary infiltrate is present, new since prior examination most in keeping with mild interstitial pulmonary edema. No pneumothorax. Small left pleural effusion. Cardiac size within normal limits. No acute bone IMPRESSION: 1. Mild interstitial pulmonary edema. 2. Small left pleural effusion. Electronically Signed   By: Dorethia Molt M.D.   On: 10/29/2023 00:57    PROCEDURES and INTERVENTIONS:  .Critical Care  Performed by: Claudene Rover, MD Authorized by: Claudene Rover, MD   Critical care provider statement:    Critical care time (minutes):   30   Critical care time was exclusive of:  Separately billable procedures and treating other patients   Critical care was necessary to treat or prevent imminent or life-threatening deterioration of the following conditions:  Respiratory failure   Critical care was time spent personally by me on the following activities:  Development of treatment plan with patient or surrogate, discussions with consultants, evaluation of patient's response to treatment, examination of patient, ordering and review of laboratory studies, ordering and review of radiographic studies, ordering and performing treatments and interventions, pulse oximetry, re-evaluation of patient's condition and review of old charts .1-3 Lead EKG Interpretation  Performed by: Claudene Rover, MD Authorized by: Claudene Rover, MD     Interpretation: abnormal     ECG rate:  106   ECG rate assessment: tachycardic     Rhythm: sinus tachycardia     Ectopy: none     Conduction: normal     Medications  ipratropium-albuterol  (DUONEB) 0.5-2.5 (3) MG/3ML nebulizer solution 6 mL (6 mLs Nebulization Given 10/28/23 2315)  methylPREDNISolone  sodium succinate (SOLU-MEDROL ) 125 mg/2 mL injection 125 mg (125 mg Intravenous Given 10/28/23 2319)  nitroGLYCERIN  (NITROGLYN) 2 % ointment 1 inch (1 inch Topical Given 10/29/23 0027)     IMPRESSION / MDM / ASSESSMENT AND PLAN / ED COURSE  I reviewed the triage vital signs and the nursing notes.  Differential diagnosis includes, but is not limited to, ACS, PTX, PNA, muscle strain/spasm, PE, dissection, anxiety, pleural effusion  {Patient presents with symptoms of an acute illness or injury that is potentially life-threatening.  Patient presents acutely short of breath, signs of COPD exacerbation, CHF (new onset?) requiring medical admission.  Hypoxic, stable on nasal cannula.  Hypertensive, improving with nitroglycerin  paste.  Congested CXR, no signs of sepsis, pneumonia or infection.  Suspect COPD/CHF  primarily.  Elevated BNP without comparison.  Troponin is mildly elevated and we will continue to trend this.  Likely elevated due to respiratory status.  Consult with medicine for admission  Clinical Course as of 10/29/23 0118  Mon Oct 28, 2023  2329 [place], daughter now at the bedside. Provides supplemental hx [DS]  Tue Oct 29, 2023  0002 reassessed [DS]    Clinical Course User Index [DS] Claudene Rover, MD     FINAL CLINICAL IMPRESSION(S) / ED DIAGNOSES   Final diagnoses:  COPD exacerbation (HCC)  SOB (shortness of breath)  Hypoxia     Rx / DC Orders   ED Discharge Orders     None        Note:  This document  was prepared using Conservation officer, historic buildings and may include unintentional dictation errors.   Claudene Rover, MD 10/29/23 [phone]

## 2023-10-29 ENCOUNTER — Other Ambulatory Visit (HOSPITAL_COMMUNITY): Payer: Self-pay

## 2023-10-29 ENCOUNTER — Observation Stay
Admit: 2023-10-29 | Discharge: 2023-10-29 | Disposition: A | Discharge status: Home / Self Care | Attending: Internal Medicine | Admitting: Internal Medicine

## 2023-10-29 ENCOUNTER — Telehealth (HOSPITAL_COMMUNITY): Payer: Self-pay | Admitting: Pharmacy Technician

## 2023-10-29 DIAGNOSIS — F17210 Nicotine dependence, cigarettes, uncomplicated: Secondary | ICD-10-CM | POA: Diagnosis present

## 2023-10-29 DIAGNOSIS — I21A1 Myocardial infarction type 2: Secondary | ICD-10-CM | POA: Diagnosis present

## 2023-10-29 DIAGNOSIS — I160 Hypertensive urgency: Secondary | ICD-10-CM | POA: Diagnosis present

## 2023-10-29 DIAGNOSIS — N1831 Chronic kidney disease, stage 3a: Secondary | ICD-10-CM | POA: Diagnosis present

## 2023-10-29 DIAGNOSIS — Z7982 Long term (current) use of aspirin: Secondary | ICD-10-CM | POA: Diagnosis not present

## 2023-10-29 DIAGNOSIS — Z8673 Personal history of transient ischemic attack (TIA), and cerebral infarction without residual deficits: Secondary | ICD-10-CM

## 2023-10-29 DIAGNOSIS — T502X5A Adverse effect of carbonic-anhydrase inhibitors, benzothiadiazides and other diuretics, initial encounter: Secondary | ICD-10-CM | POA: Diagnosis present

## 2023-10-29 DIAGNOSIS — E785 Hyperlipidemia, unspecified: Secondary | ICD-10-CM | POA: Diagnosis present

## 2023-10-29 DIAGNOSIS — J441 Chronic obstructive pulmonary disease with (acute) exacerbation: Secondary | ICD-10-CM | POA: Diagnosis present

## 2023-10-29 DIAGNOSIS — E1122 Type 2 diabetes mellitus with diabetic chronic kidney disease: Secondary | ICD-10-CM | POA: Diagnosis present

## 2023-10-29 DIAGNOSIS — I130 Hypertensive heart and chronic kidney disease with heart failure and stage 1 through stage 4 chronic kidney disease, or unspecified chronic kidney disease: Secondary | ICD-10-CM | POA: Diagnosis present

## 2023-10-29 DIAGNOSIS — J9601 Acute respiratory failure with hypoxia: Secondary | ICD-10-CM | POA: Diagnosis present

## 2023-10-29 DIAGNOSIS — Z8249 Family history of ischemic heart disease and other diseases of the circulatory system: Secondary | ICD-10-CM | POA: Diagnosis not present

## 2023-10-29 DIAGNOSIS — R739 Hyperglycemia, unspecified: Secondary | ICD-10-CM

## 2023-10-29 DIAGNOSIS — I509 Heart failure, unspecified: Secondary | ICD-10-CM

## 2023-10-29 DIAGNOSIS — I2489 Other forms of acute ischemic heart disease: Secondary | ICD-10-CM | POA: Diagnosis present

## 2023-10-29 DIAGNOSIS — I4719 Other supraventricular tachycardia: Secondary | ICD-10-CM | POA: Diagnosis present

## 2023-10-29 DIAGNOSIS — D631 Anemia in chronic kidney disease: Secondary | ICD-10-CM | POA: Diagnosis present

## 2023-10-29 DIAGNOSIS — E669 Obesity, unspecified: Secondary | ICD-10-CM | POA: Diagnosis present

## 2023-10-29 DIAGNOSIS — I5031 Acute diastolic (congestive) heart failure: Secondary | ICD-10-CM | POA: Diagnosis present

## 2023-10-29 DIAGNOSIS — Z79899 Other long term (current) drug therapy: Secondary | ICD-10-CM | POA: Diagnosis not present

## 2023-10-29 DIAGNOSIS — N179 Acute kidney failure, unspecified: Secondary | ICD-10-CM | POA: Diagnosis present

## 2023-10-29 DIAGNOSIS — K5900 Constipation, unspecified: Secondary | ICD-10-CM | POA: Diagnosis present

## 2023-10-29 DIAGNOSIS — E1165 Type 2 diabetes mellitus with hyperglycemia: Secondary | ICD-10-CM | POA: Diagnosis present

## 2023-10-29 DIAGNOSIS — K649 Unspecified hemorrhoids: Secondary | ICD-10-CM | POA: Diagnosis present

## 2023-10-29 DIAGNOSIS — Z7951 Long term (current) use of inhaled steroids: Secondary | ICD-10-CM | POA: Diagnosis not present

## 2023-10-29 DIAGNOSIS — R230 Cyanosis: Secondary | ICD-10-CM | POA: Diagnosis present

## 2023-10-29 DIAGNOSIS — R7989 Other specified abnormal findings of blood chemistry: Secondary | ICD-10-CM

## 2023-10-29 DIAGNOSIS — R0602 Shortness of breath: Secondary | ICD-10-CM | POA: Diagnosis present

## 2023-10-29 DIAGNOSIS — J90 Pleural effusion, not elsewhere classified: Secondary | ICD-10-CM | POA: Diagnosis present

## 2023-10-29 LAB — LIPID PANEL
Cholesterol: 218 mg/dL — ABNORMAL HIGH (ref 0–200)
HDL: 72 mg/dL (ref >40)
LDL Cholesterol: 138 mg/dL — ABNORMAL HIGH (ref 0–99)
Total CHOL/HDL Ratio: 3 ratio
Triglycerides: 41 mg/dL (ref <150)
VLDL: 8 mg/dL (ref 0–40)

## 2023-10-29 LAB — URINALYSIS, ROUTINE W REFLEX MICROSCOPIC
Bilirubin Urine: NEGATIVE
Glucose, UA: NEGATIVE mg/dL
Hgb urine dipstick: NEGATIVE
Ketones, ur: NEGATIVE mg/dL
Leukocytes,Ua: NEGATIVE
Nitrite: NEGATIVE
Protein, ur: NEGATIVE mg/dL
Specific Gravity, Urine: 1.005 (ref 1.005–1.030)
pH: 6 (ref 5.0–8.0)

## 2023-10-29 LAB — CBC
HCT: 38.5 % (ref 36.0–46.0)
Hemoglobin: 12.1 g/dL (ref 12.0–15.0)
MCH: 29 pg (ref 26.0–34.0)
MCHC: 31.4 g/dL (ref 30.0–36.0)
MCV: 92.3 fL (ref 80.0–100.0)
Platelets: 250 K/uL (ref 150–400)
RBC: 4.17 MIL/uL (ref 3.87–5.11)
RDW: 13.2 % (ref 11.5–15.5)
WBC: 8.2 K/uL (ref 4.0–10.5)
nRBC: 0 % (ref 0.0–0.2)

## 2023-10-29 LAB — BASIC METABOLIC PANEL WITH GFR
Anion gap: 16 — ABNORMAL HIGH (ref 5–15)
BUN: 22 mg/dL (ref 8–23)
CO2: 21 mmol/L — ABNORMAL LOW (ref 22–32)
Calcium: 8.8 mg/dL — ABNORMAL LOW (ref 8.9–10.3)
Chloride: 103 mmol/L (ref 98–111)
Creatinine, Ser: 1.23 mg/dL — ABNORMAL HIGH (ref 0.44–1.00)
GFR, Estimated: 48 mL/min — ABNORMAL LOW (ref >60)
Glucose, Bld: 228 mg/dL — ABNORMAL HIGH (ref 70–99)
Potassium: 3.5 mmol/L (ref 3.5–5.1)
Sodium: 140 mmol/L (ref 135–145)

## 2023-10-29 LAB — LACTIC ACID, PLASMA
Lactic Acid, Venous: 3.7 mmol/L (ref 0.5–1.9)
Lactic Acid, Venous: 3.2 mmol/L (ref 0.5–1.9)

## 2023-10-29 LAB — ECHOCARDIOGRAM COMPLETE
AR max vel: 2.35 cm2
AV Peak grad: 9.6 mmHg
Ao pk vel: 1.55 m/s
Area-P 1/2: 5.97 cm2
Height: 60 in
MV M vel: 5.95 m/s
MV Peak grad: 141.8 mmHg
S' Lateral: 2.4 cm
Weight: 1809.54 [oz_av]

## 2023-10-29 LAB — TROPONIN I (HIGH SENSITIVITY)
Troponin I (High Sensitivity): 79 ng/L — ABNORMAL HIGH (ref <18)
Troponin I (High Sensitivity): 100 ng/L (ref <18)
Troponin I (High Sensitivity): 123 ng/L (ref <18)
Troponin I (High Sensitivity): 125 ng/L (ref <18)

## 2023-10-29 LAB — GLUCOSE, CAPILLARY
Glucose-Capillary: 196 mg/dL — ABNORMAL HIGH (ref 70–99)
Glucose-Capillary: 144 mg/dL — ABNORMAL HIGH (ref 70–99)

## 2023-10-29 LAB — PHOSPHORUS: Phosphorus: 2.6 mg/dL (ref 2.5–4.6)

## 2023-10-29 LAB — CBG MONITORING, ED
Glucose-Capillary: 247 mg/dL — ABNORMAL HIGH (ref 70–99)
Glucose-Capillary: 310 mg/dL — ABNORMAL HIGH (ref 70–99)

## 2023-10-29 LAB — BRAIN NATRIURETIC PEPTIDE: B Natriuretic Peptide: 900.3 pg/mL — ABNORMAL HIGH (ref 0.0–100.0)

## 2023-10-29 LAB — HIV ANTIBODY (ROUTINE TESTING W REFLEX): HIV Screen 4th Generation wRfx: NONREACTIVE

## 2023-10-29 MED ORDER — ACETAMINOPHEN 325 MG PO TABS
650.0000 mg | ORAL_TABLET | Freq: Four times a day (QID) | ORAL | Status: DC | PRN
  Administered 2023-10-29 – 2023-10-30 (×4): 650 mg via ORAL
  Filled 2023-10-29 (×2): qty 2

## 2023-10-29 MED ORDER — ONDANSETRON HCL 4 MG PO TABS: 4.0000 mg | ORAL_TABLET | Freq: Four times a day (QID) | ORAL | Status: DC | PRN

## 2023-10-29 MED ORDER — MORPHINE SULFATE (PF) 4 MG/ML IV SOLN
4.0000 mg | INTRAVENOUS | Status: AC | PRN
  Administered 2023-10-29 – 2023-10-30 (×4): 4 mg via INTRAVENOUS
  Filled 2023-10-29 (×2): qty 1

## 2023-10-29 MED ORDER — INSULIN ASPART 100 UNIT/ML IJ SOLN: 0.0000 [IU] | Freq: Every day | INTRAMUSCULAR | Status: DC

## 2023-10-29 MED ORDER — FREE WATER: 500.0000 mL | Freq: Once | Status: DC

## 2023-10-29 MED ORDER — ALBUTEROL SULFATE (2.5 MG/3ML) 0.083% IN NEBU
2.5000 mg | INHALATION_SOLUTION | RESPIRATORY_TRACT | Status: DC | PRN
  Administered 2023-10-30 (×2): 2.5 mg via RESPIRATORY_TRACT
  Filled 2023-10-29: qty 3

## 2023-10-29 MED ORDER — ASPIRIN 81 MG PO TBEC
81.0000 mg | DELAYED_RELEASE_TABLET | Freq: Every day | ORAL | Status: DC
  Administered 2023-10-29 – 2023-11-02 (×7): 81 mg via ORAL
  Filled 2023-10-29 (×5): qty 1

## 2023-10-29 MED ORDER — ONDANSETRON HCL 4 MG/2ML IJ SOLN
4.0000 mg | Freq: Four times a day (QID) | INTRAMUSCULAR | Status: DC | PRN
  Administered 2023-10-31 – 2023-11-02 (×2): 4 mg via INTRAVENOUS
  Filled 2023-10-29 (×2): qty 2

## 2023-10-29 MED ORDER — IPRATROPIUM-ALBUTEROL 0.5-2.5 (3) MG/3ML IN SOLN
3.0000 mL | Freq: Four times a day (QID) | RESPIRATORY_TRACT | Status: DC
Start: 2023-10-29 — End: 2023-10-29
  Administered 2023-10-29 (×4): 3 mL via RESPIRATORY_TRACT
  Filled 2023-10-29 (×2): qty 3

## 2023-10-29 MED ORDER — IPRATROPIUM-ALBUTEROL 0.5-2.5 (3) MG/3ML IN SOLN
3.0000 mL | Freq: Two times a day (BID) | RESPIRATORY_TRACT | Status: DC
  Administered 2023-10-30 (×2): 3 mL via RESPIRATORY_TRACT
  Filled 2023-10-29 (×2): qty 3

## 2023-10-29 MED ORDER — LISINOPRIL 10 MG PO TABS
10.0000 mg | ORAL_TABLET | Freq: Every day | ORAL | Status: DC
  Administered 2023-10-29 (×2): 10 mg via ORAL
  Filled 2023-10-29: qty 1

## 2023-10-29 MED ORDER — ATORVASTATIN CALCIUM 80 MG PO TABS
80.0000 mg | ORAL_TABLET | Freq: Every day | ORAL | Status: DC
  Administered 2023-10-30 – 2023-11-02 (×5): 80 mg via ORAL
  Filled 2023-10-29 (×4): qty 1

## 2023-10-29 MED ORDER — OXYCODONE HCL 5 MG PO TABS
5.0000 mg | ORAL_TABLET | Freq: Four times a day (QID) | ORAL | Status: DC | PRN
  Administered 2023-10-29 – 2023-11-02 (×14): 5 mg via ORAL
  Filled 2023-10-29 (×10): qty 1

## 2023-10-29 MED ORDER — ACETAMINOPHEN 650 MG RE SUPP
650.0000 mg | Freq: Four times a day (QID) | RECTAL | Status: DC | PRN
Start: 2023-10-29 — End: 2023-10-31

## 2023-10-29 MED ORDER — INSULIN ASPART 100 UNIT/ML IJ SOLN
0.0000 [IU] | Freq: Three times a day (TID) | INTRAMUSCULAR | Status: DC
  Administered 2023-10-29: 2 [IU] via SUBCUTANEOUS
  Administered 2023-10-29 (×2): 7 [IU] via SUBCUTANEOUS
  Administered 2023-10-29: 3 [IU] via SUBCUTANEOUS
  Administered 2023-10-29: 2 [IU] via SUBCUTANEOUS
  Administered 2023-10-29: 3 [IU] via SUBCUTANEOUS
  Administered 2023-10-30: 2 [IU] via SUBCUTANEOUS
  Administered 2023-10-30: 1 [IU] via SUBCUTANEOUS
  Administered 2023-10-30: 2 [IU] via SUBCUTANEOUS
  Administered 2023-10-30 – 2023-10-31 (×4): 1 [IU] via SUBCUTANEOUS
  Administered 2023-10-31: 2 [IU] via SUBCUTANEOUS
  Administered 2023-11-01: 1 [IU] via SUBCUTANEOUS
  Administered 2023-11-01: 2 [IU] via SUBCUTANEOUS
  Filled 2023-10-29 (×7): qty 1
  Filled 2023-10-29: qty 3
  Filled 2023-10-29 (×2): qty 1

## 2023-10-29 MED ORDER — HYDRALAZINE HCL 20 MG/ML IJ SOLN
5.0000 mg | INTRAMUSCULAR | Status: DC | PRN
  Administered 2023-11-02: 5 mg via INTRAVENOUS
  Filled 2023-10-29: qty 1

## 2023-10-29 MED ORDER — NITROGLYCERIN 2 % TD OINT
1.0000 [in_us] | TOPICAL_OINTMENT | Freq: Once | TRANSDERMAL | Status: AC
  Administered 2023-10-29 (×2): 1 [in_us] via TOPICAL
  Filled 2023-10-29: qty 1

## 2023-10-29 MED ORDER — ASPIRIN 81 MG PO CHEW
81.0000 mg | CHEWABLE_TABLET | ORAL | Status: AC
  Administered 2023-10-30 (×2): 81 mg via ORAL
  Filled 2023-10-29: qty 1

## 2023-10-29 MED ORDER — CHLORHEXIDINE GLUCONATE CLOTH 2 % EX PADS
6.0000 | MEDICATED_PAD | Freq: Every day | CUTANEOUS | Status: DC
  Administered 2023-10-29 – 2023-11-01 (×6): 6 via TOPICAL

## 2023-10-29 MED ORDER — METHYLPREDNISOLONE SODIUM SUCC 40 MG IJ SOLR
40.0000 mg | Freq: Two times a day (BID) | INTRAMUSCULAR | Status: AC
  Administered 2023-10-29 (×2): 40 mg via INTRAVENOUS
  Filled 2023-10-29 (×2): qty 1

## 2023-10-29 MED ORDER — SODIUM CHLORIDE 0.9 % IV SOLN: INTRAVENOUS | Status: AC

## 2023-10-29 MED ORDER — GUAIFENESIN ER 600 MG PO TB12
600.0000 mg | ORAL_TABLET | Freq: Two times a day (BID) | ORAL | Status: DC
  Administered 2023-10-29 – 2023-11-02 (×15): 600 mg via ORAL
  Filled 2023-10-29 (×11): qty 1

## 2023-10-29 MED ORDER — FUROSEMIDE 10 MG/ML IJ SOLN
40.0000 mg | Freq: Two times a day (BID) | INTRAMUSCULAR | Status: DC
  Administered 2023-10-29 (×4): 40 mg via INTRAVENOUS
  Filled 2023-10-29 (×2): qty 4

## 2023-10-29 MED ORDER — NITROGLYCERIN 2 % TD OINT
0.5000 [in_us] | TOPICAL_OINTMENT | Freq: Four times a day (QID) | TRANSDERMAL | Status: DC
  Administered 2023-10-29 – 2023-10-30 (×8): 0.5 [in_us] via TOPICAL
  Filled 2023-10-29 (×4): qty 1

## 2023-10-29 MED ORDER — AMLODIPINE BESYLATE 5 MG PO TABS
5.0000 mg | ORAL_TABLET | Freq: Every day | ORAL | Status: DC
  Administered 2023-10-29 – 2023-10-31 (×5): 5 mg via ORAL
  Filled 2023-10-29 (×3): qty 1

## 2023-10-29 MED ORDER — PREDNISONE 20 MG PO TABS
40.0000 mg | ORAL_TABLET | Freq: Every day | ORAL | Status: AC
  Administered 2023-10-30 – 2023-11-02 (×5): 40 mg via ORAL
  Filled 2023-10-29 (×4): qty 2

## 2023-10-29 MED ORDER — MELATONIN 5 MG PO TABS
5.0000 mg | ORAL_TABLET | Freq: Every evening | ORAL | Status: DC | PRN
  Administered 2023-10-29 (×2): 5 mg via ORAL
  Filled 2023-10-29 (×2): qty 1

## 2023-10-29 MED ORDER — BISOPROLOL FUMARATE 5 MG PO TABS
5.0000 mg | ORAL_TABLET | Freq: Every day | ORAL | Status: DC
  Administered 2023-10-29 – 2023-11-02 (×7): 5 mg via ORAL
  Filled 2023-10-29 (×5): qty 1

## 2023-10-29 MED ORDER — ATORVASTATIN CALCIUM 20 MG PO TABS
40.0000 mg | ORAL_TABLET | Freq: Every day | ORAL | Status: DC
  Administered 2023-10-29 (×2): 40 mg via ORAL
  Filled 2023-10-29: qty 2

## 2023-10-29 MED ORDER — LOSARTAN POTASSIUM 50 MG PO TABS
50.0000 mg | ORAL_TABLET | Freq: Every day | ORAL | Status: DC
  Administered 2023-10-29 (×2): 50 mg via ORAL
  Filled 2023-10-29: qty 1

## 2023-10-29 MED ORDER — IPRATROPIUM-ALBUTEROL 0.5-2.5 (3) MG/3ML IN SOLN
3.0000 mL | Freq: Four times a day (QID) | RESPIRATORY_TRACT | Status: DC
  Administered 2023-10-29 (×2): 3 mL via RESPIRATORY_TRACT
  Filled 2023-10-29: qty 3

## 2023-10-29 MED ORDER — ENOXAPARIN SODIUM 40 MG/0.4ML IJ SOSY
40.0000 mg | PREFILLED_SYRINGE | INTRAMUSCULAR | Status: DC
  Administered 2023-10-29 – 2023-10-30 (×4): 40 mg via SUBCUTANEOUS
  Filled 2023-10-29 (×2): qty 0.4

## 2023-10-29 MED ORDER — MORPHINE SULFATE (PF) 4 MG/ML IV SOLN
4.0000 mg | Freq: Once | INTRAVENOUS | Status: AC
  Administered 2023-10-29 (×2): 4 mg via INTRAVENOUS
  Filled 2023-10-29: qty 1

## 2023-10-29 MED ORDER — FUROSEMIDE 10 MG/ML IJ SOLN
40.0000 mg | Freq: Once | INTRAMUSCULAR | Status: AC
  Administered 2023-10-29 (×2): 40 mg via INTRAVENOUS
  Filled 2023-10-29: qty 4

## 2023-10-29 NOTE — Hospital Course (Signed)
 Kimberly Fisher

## 2023-10-29 NOTE — Assessment & Plan Note (Signed)
 Random blood glucose 221. No prior history of diabetes We will get A1c to [facility] for diabetes Sliding scale coverage

## 2023-10-29 NOTE — H&P (Signed)
 History and Physical    Patient: Kimberly Fisher FMW:[id number] DOB: 1956/03/19 DOA: 10/28/2023 DOS: the patient was seen and examined on 10/29/2023 PCP: Pcp, No  Patient coming from: Home  Chief Complaint:  Chief Complaint  Patient presents with   Shortness of Breath    HPI: Kimberly Fisher is a 68 y.o. female with medical history significant for HTN, COPD, TIA 2022 in the setting of hypertensive urgency, being admitted with new onset CHF as well as possible COPD.  She arrived by EMS on CPAP after being called out for respiratory distress.  She denied chest pain, fever or chills.  She endorses orthopnea.  En route she received DuoNebs, and magnesium. In the ED, hypertensive to 231/119, tachycardic to 105, tachypneic to the mid 20s, transitioned to O2 at 6 L on arrival, maintaining sats in the mid 90s. Labs notable for troponin 50 and BNP 900 CBC WNL CMP notable for hyperglycemia of 221, Baseline creatinine of 1.15 EKG showing sinus tachycardia at 105 ST depression with T wave inversion inferolateral leads Chest x-ray with mild interstitial pulmonary edema and small left pleural effusion Patient treated with DuoNebs and Solu-Medrol  and placed on Nitropaste and given a dose of Lasix  Admission requested     Review of Systems: As mentioned in the history of present illness. All other systems reviewed and are negative.  Past Medical History:  Diagnosis Date   Anxiety    Asthma    Cardiomegaly    COPD (chronic obstructive pulmonary disease) (HCC)    GERD (gastroesophageal reflux disease)    Hypertension    Low back pain    Mitral valve insufficiency    Nicotine  dependence    Obesity    Past Surgical History:  Procedure Laterality Date   CESAREAN SECTION     COLONOSCOPY WITH PROPOFOL  N/A 07/18/2015   Procedure: COLONOSCOPY WITH PROPOFOL ;  Surgeon: Donnice Vaughn Manes, MD;  Location[facility] ENDOSCOPY;  Service: Endoscopy;  Laterality: N/A;   ESOPHAGOGASTRODUODENOSCOPY (EGD) WITH  PROPOFOL  N/A 07/18/2015   Procedure: ESOPHAGOGASTRODUODENOSCOPY (EGD) WITH PROPOFOL ;  Surgeon: Donnice Vaughn Manes, MD;  Location: [facility] ENDOSCOPY;  Service: Endoscopy;  Laterality: N/A;   Essential Tubal Ligation     TONSILLECTOMY     Social History:  reports that she has been smoking. She has never used smokeless tobacco. She reports that she does not drink alcohol and does not use drugs.  Allergies  Allergen Reactions   Sulfa Antibiotics Nausea And Vomiting    History reviewed. No pertinent family history.  Prior to Admission medications   Medication Sig Start Date End Date Taking? Authorizing Provider  albuterol  (VENTOLIN  HFA) 108 (90 Base) MCG/ACT inhaler Inhale 1-2 puffs into the lungs every 4 (four) hours as needed for wheezing or shortness of breath. 10/04/20 11/03/20  Jhonny Calvin NOVAK, MD  amLODipine  (NORVASC ) 5 MG tablet Take 1 tablet (5 mg total) by mouth daily. 11/28/22 11/28/23  Gordan Huxley, MD  aspirin  EC 81 MG EC tablet Take 1 tablet (81 mg total) by mouth daily. Swallow whole. 10/05/20   Jhonny Calvin NOVAK, MD  atorvastatin  (LIPITOR ) 40 MG tablet Take 1 tablet (40 mg total) by mouth daily. 10/04/20 11/03/20  Jhonny Calvin NOVAK, MD  budesonide -formoterol  (SYMBICORT ) 160-4.5 MCG/ACT inhaler Inhale 2 puffs into the lungs 4 (four) times daily. 10/04/20 11/03/20  Jhonny Calvin NOVAK, MD  hydrocortisone -pramoxine (PROCTOFOAM-HC) rectal foam Place 1 applicator rectally 2 (two) times daily. 11/28/22   Gordan Huxley, MD  lisinopril  (ZESTRIL ) 10 MG tablet Take 1 tablet (10  mg total) by mouth daily. 12/19/22 12/19/23  Saunders Shona CROME, PA-C  loperamide  (IMODIUM  A-D) 2 MG tablet Take 1 tablet (2 mg total) by mouth 4 (four) times daily as needed for diarrhea or loose stools. 08/20/21   Cuthriell, Dorn BIRCH, PA-C  nicotine  (NICODERM CQ  - DOSED IN MG/24 HOURS) 21 mg/24hr patch Place 1 patch (21 mg total) onto the skin daily. 10/05/20   Jhonny Calvin NOVAK, MD  phenylephrine -shark liver oil-mineral  oil-petrolatum (PREPARATION H) 0.25-14-74.9 % rectal ointment Place 1 Application rectally 2 (two) times daily as needed for hemorrhoids. 11/11/22   Jacolyn Pae, MD    Physical Exam: Vitals:   10/29/23 0000 10/29/23 0015 10/29/23 0030 10/29/23 0045  BP: (!) 218/113  (!) 209/114   Pulse: (!) 102 (!) 109 (!) 101 100  Resp: 18 (!) 22 (!) 26 (!) 21  Temp:      TempSrc:      SpO2: 100% 99% 100% 100%   Physical Exam Vitals and nursing note reviewed.  Constitutional:      General: She is not in acute distress. HENT:     Head: Normocephalic and atraumatic.  Cardiovascular:     Rate and Rhythm: Regular rhythm. Tachycardia present.     Heart sounds: Normal heart sounds.  Pulmonary:     Effort: Tachypnea present.     Breath sounds: Wheezing present.  Abdominal:     Palpations: Abdomen is soft.     Tenderness: There is no abdominal tenderness.  Musculoskeletal:     Right lower leg: No edema.     Left lower leg: No edema.  Neurological:     Mental Status: Mental status is at baseline.     Labs on Admission: I have personally reviewed following labs and imaging studies  CBC: Recent Labs  Lab 10/28/23 2309  WBC 7.7  NEUTROABS 5.0  HGB 12.3  HCT 38.6  MCV 92.8  PLT 271   Basic Metabolic Panel: Recent Labs  Lab 10/28/23 2309  NA 140  K 3.8  CL 109  CO2 21*  GLUCOSE 221*  BUN 22  CREATININE 1.15*  CALCIUM  8.5*  MG 3.8*   GFR: CrCl cannot be calculated (Unknown ideal weight.). Liver Function Tests: Recent Labs  Lab 10/28/23 2309  AST 45*  ALT 27  ALKPHOS 78  BILITOT 0.4  PROT 6.5  ALBUMIN 3.2*   No results for input(s): LIPASE, AMYLASE in the last 168 hours. No results for input(s): AMMONIA in the last 168 hours. Coagulation Profile: No results for input(s): INR, PROTIME in the last 168 hours. Cardiac Enzymes: No results for input(s): CKTOTAL, CKMB, CKMBINDEX, TROPONINI in the last 168 hours. BNP (last 3 results) No results for  input(s): PROBNP in the last 8760 hours. HbA1C: No results for input(s): HGBA1C in the last 72 hours. CBG: No results for input(s): GLUCAP in the last 168 hours. Lipid Profile: No results for input(s): CHOL, HDL, LDLCALC, TRIG, CHOLHDL, LDLDIRECT in the last 72 hours. Thyroid  Function Tests: No results for input(s): TSH, T4TOTAL, FREET4, T3FREE, THYROIDAB in the last 72 hours. Anemia Panel: No results for input(s): VITAMINB12, FOLATE, FERRITIN, TIBC, IRON, RETICCTPCT in the last 72 hours. Urine analysis:    Component Value Date/Time   COLORURINE YELLOW (A) 11/11/2022 2135   APPEARANCEUR HAZY (A) 11/11/2022 2135   APPEARANCEUR Clear 10/15/2013 1331   LABSPEC 1.023 11/11/2022 2135   LABSPEC 1.015 10/15/2013 1331   PHURINE 5.0 11/11/2022 2135   GLUCOSEU NEGATIVE 11/11/2022 2135   GLUCOSEU Negative  10/15/2013 1331   HGBUR MODERATE (A) 11/11/2022 2135   BILIRUBINUR NEGATIVE 11/11/2022 2135   BILIRUBINUR Negative 10/15/2013 1331   KETONESUR NEGATIVE 11/11/2022 2135   PROTEINUR 30 (A) 11/11/2022 2135   NITRITE NEGATIVE 11/11/2022 2135   LEUKOCYTESUR TRACE (A) 11/11/2022 2135   LEUKOCYTESUR Negative 10/15/2013 1331    Radiological Exams on Admission: DG Chest Portable 1 View Result Date: 10/29/2023 CLINICAL DATA:  COPD exacerbation EXAM: PORTABLE CHEST 1 VIEW COMPARISON:  Seventy teen 22 FINDINGS: Lung volumes are small. Superimposed mild diffuse interstitial pulmonary infiltrate is present, new since prior examination most in keeping with mild interstitial pulmonary edema. No pneumothorax. Small left pleural effusion. Cardiac size within normal limits. No acute bone IMPRESSION: 1. Mild interstitial pulmonary edema. 2. Small left pleural effusion. Electronically Signed   By: Dorethia Molt M.D.   On: 10/29/2023 00:57   Data Reviewed for HPI: Relevant notes from primary care and specialist visits, past discharge summaries as available in EHR,  including Care Everywhere. Prior diagnostic testing as pertinent to current admission diagnoses Updated medications and problem lists for reconciliation ED course, including vitals, labs, imaging, treatment and response to treatment Triage notes, nursing and pharmacy notes and ED provider's notes Notable results as noted above in HPI      Assessment and Plan: * Acute respiratory failure with hypoxia (HCC) Patient arrived on CPAP by EMS.  O2 sat reportedly 32% on room air with EMS Likely secondary to a combination of CHF and COPD Continue BiPAP and wean as tolerated Treating each etiology as outlined in the respective problem   COPD with acute exacerbation (HCC) Scheduled and as needed nebulized bronchodilators IV steroids Antitussives, flutter valve Supplemental O2 as above  Acute exacerbation of CHF (congestive heart failure) (HCC) Hypertensive urgency/emergency No prior history of CHF Continue Nitropaste for BP control and CHF management IV Lasix  Continue home lisinopril  and amlodipine   Elevated troponin Troponin 50--> 79, without chest pain or ischemic EKG findings Suspect demand ischemia Echocardiogram to evaluate for focal wall motion abnormality  Hyperglycemia Random blood glucose 221. No prior history of diabetes We will get A1c to [facility] for diabetes Sliding scale coverage  Stage 3a chronic kidney disease (HCC) Renal function at baseline  History of TIA (transient ischemic attack) No acute issues     DVT prophylaxis: Lovenox   Consults: [facility] cardiology  Advance Care Planning:   Code Status: Prior   Family Communication: Daughter at bedside  Disposition Plan: Back to previous home environment  Severity of Illness: The appropriate patient status for this patient is OBSERVATION. Observation status is judged to be reasonable and necessary in order to provide the required intensity of service to ensure the patient's safety. The patient's presenting  symptoms, physical exam findings, and initial radiographic and laboratory data in the context of their medical condition is felt to place them at decreased risk for further clinical deterioration. Furthermore, it is anticipated that the patient will be medically stable for discharge from the hospital within 2 midnights of admission.   Author: Delayne LULLA Solian, MD 10/29/2023 2:05 AM  For on call review [email or web address].

## 2023-10-29 NOTE — Assessment & Plan Note (Signed)
 Patient arrived on CPAP by EMS.  O2 sat reportedly 32% on room air with EMS Likely secondary to a combination of CHF and COPD Continue BiPAP and wean as tolerated Treating each etiology as outlined in the respective problem

## 2023-10-29 NOTE — TOC CM/SW Note (Signed)
..  Transition of Care [facility]) - Inpatient Brief Assessment   Patient Details  Name: Kimberly Fisher MRN: [id number] Date of Birth: [birth year]  Transition of Care [facility]) CM/SW Contact:    Edsel DELENA Fischer, LCSW Phone Number: 10/29/2023, 9:55 AM   Clinical Narrative:    Transition of Care Asessment:

## 2023-10-29 NOTE — Consult Note (Addendum)
 [facility] CARDIOLOGY CONSULT NOTE       Patient ID: Kimberly Fisher MRN: [id number] DOB/AGE: 11/30/1955 68 y.o.  Admit date: 10/28/2023 Referring Physician Dr. Cleatus Primary Physician Pcp, No Primary Cardiologist None Reason for Consultation acute CHF  HPI: Kimberly Fisher is a 68 y.o. female  with a past medical history of hypertension, hyperlipidemia, history of TIA in setting of hypertensive urgency (2022), COPD, tobacco use who presented to the ED on 10/28/2023 for worsening shortness of breath, abdominal bloating for the past week.  Patient also endorses chest pressure that radiates to her left arm and back that started yesterday that has been constant.  Patient denies any history of CAD, MI, HF or AF.  Patient states her mom died from a heart attack when she was 67 years old.  Cardiology was consulted for further evaluation.   Work up in the ED notable for sodium 140, potassium 3.8, Mg 3.8, creatinine 1.15, GFR 53.  Hemoglobin 12.3, WBC 7.7, platelets 271.  AST mildly elevated at 45. EKG with sinus tachycardia, rate 105 bpm with nonspecific ST-T wave changes.  Troponins mildly elevated and trending 50 > 79.  BNP elevated at 900.  CXR with small left pleural effusion and pulmonary vascular congestion.  Patient has received 2X of IV Lasix  40 mg, Solu-Medrol  and DuoNeb treatments.  At the time of my evaluation this AM, patient was resting comfortably in ED stretcher with daughter at bedside.  We discussed patient's symptoms in further detail.  Patient states she has been having worsening shortness of breath and abdominal bloating for the past week.  Patient also endorses chest pressure that radiates to her left arm and back that has been constant since yesterday.  Patient endorses tobacco use.  Patient states she does not require any O2 at home.  Reports she has noticed that she is short of breath if she walks for a long time but denies any chest pain with exertion.  Patient denies any  history of CAD, MI, HF, AF and states she has never done a stress test or LHC. Patient states she used to take BP medications but stopped taking it a few years ago. Patient states she hasn't been on any medications for awhile. Patient's daughter states patient is noncompliant with medications and follow-up appts. Patient states her mom died of a heart attack when her mom was 19 years old.  Patient states the DuoNeb treatments has not helped her shortness of breath.  However reports her breathing has improved since admission s/p IV Lasix .  Urine is very clear.  At time of my evaluation this afternoon, patient states SOB is much improved and has weaned to room air with stable SpO2 s/p IV lasix . Patient continues to diuresis well. Patient states she still has chest pressure with minimal relief with nitropaste. BP and HR much improved.    Review of systems complete and found to be negative unless listed above    Past Medical History:  Diagnosis Date   Anxiety    Asthma    Cardiomegaly    COPD (chronic obstructive pulmonary disease) (HCC)    GERD (gastroesophageal reflux disease)    Hypertension    Low back pain    Mitral valve insufficiency    Nicotine  dependence    Obesity     Past Surgical History:  Procedure Laterality Date   CESAREAN SECTION     COLONOSCOPY WITH PROPOFOL  N/A 07/18/2015   Procedure: COLONOSCOPY WITH PROPOFOL ;  Surgeon: Donnice Vaughn Manes, MD;  Location: [facility] ENDOSCOPY;  Service: Endoscopy;  Laterality: N/A;   ESOPHAGOGASTRODUODENOSCOPY (EGD) WITH PROPOFOL  N/A 07/18/2015   Procedure: ESOPHAGOGASTRODUODENOSCOPY (EGD) WITH PROPOFOL ;  Surgeon: Donnice Vaughn Manes, MD;  Location: [facility] ENDOSCOPY;  Service: Endoscopy;  Laterality: N/A;   Essential Tubal Ligation     TONSILLECTOMY      (Not in a hospital admission)  Social History   Socioeconomic History   Marital status: Widowed    Spouse name: Not on file   Number of children: Not on file   Years of education: Not on  file   Highest education level: Not on file  Occupational History   Not on file  Tobacco Use   Smoking status: Every Day    Current packs/day: 1.00    Types: Cigarettes   Smokeless tobacco: Never  Substance and Sexual Activity   Alcohol use: No   Drug use: No   Sexual activity: Not on file  Other Topics Concern   Not on file  Social History Narrative   Not on file   Social Drivers of Health   Financial Resource Strain: Not on file  Food Insecurity: Not on file  Transportation Needs: Not on file  Physical Activity: Not on file  Stress: Not on file  Social Connections: Not on file  Intimate Partner Violence: Not on file    History reviewed. No pertinent family history.   Vitals:   10/29/23 0730 10/29/23 0745 10/29/23 0800 10/29/23 1030  BP: (!) 171/88  (!) 152/94 (!) 149/87  Pulse: (!) 104  96 (!) 102  Resp: (!) 21  (!) 29 (!) 23  Temp:  (!) 97 F (36.1 C)    TempSrc:  Oral    SpO2: 99%  98% 99%  Weight:      Height:        PHYSICAL EXAM General: Moderately ill-appearing elderly female, well nourished, in no acute distress. HEENT: Normocephalic and atraumatic. Neck: No JVD.   Lungs: Normal respiratory effort on room air.  Diminished breath sounds bilaterally. Heart: HRR. Normal S1 and S2 without gallops or murmurs.  Abdomen: Non-distended appearing.  Msk: Normal strength and tone for age. Extremities: Warm and well perfused. No clubbing, cyanosis, edema.  Neuro: Alert and oriented X 3. Psych: Answers questions appropriately.   Labs: Basic Metabolic Panel: Recent Labs    10/28/23 2309 10/29/23 1018  NA 140 140  K 3.8 3.5  CL 109 103  CO2 21* 21*  GLUCOSE 221* 228*  BUN 22 22  CREATININE 1.15* 1.23*  CALCIUM  8.5* 8.8*  MG 3.8*  --   PHOS  --  2.6   Liver Function Tests: Recent Labs    10/28/23 2309  AST 45*  ALT 27  ALKPHOS 78  BILITOT 0.4  PROT 6.5  ALBUMIN 3.2*   No results for input(s): LIPASE, AMYLASE in the last 72  hours. CBC: Recent Labs    10/28/23 2309 10/29/23 1018  WBC 7.7 8.2  NEUTROABS 5.0  --   HGB 12.3 12.1  HCT 38.6 38.5  MCV 92.8 92.3  PLT 271 250   Cardiac Enzymes: Recent Labs    10/29/23 0235 10/29/23 1015 10/29/23 1218  TROPONINIHS 79* 100* 123*   BNP: Recent Labs    10/28/23 2309  BNP 900.3*   D-Dimer: No results for input(s): DDIMER in the last 72 hours. Hemoglobin A1C: No results for input(s): HGBA1C in the last 72 hours. Fasting Lipid Panel: Recent Labs    10/29/23 1018  CHOL 218*  HDL 72  LDLCALC 138*  TRIG 41  CHOLHDL 3.0   Thyroid  Function Tests: No results for input(s): TSH, T4TOTAL, T3FREE, THYROIDAB in the last 72 hours.  Invalid input(s): FREET3 Anemia Panel: No results for input(s): VITAMINB12, FOLATE, FERRITIN, TIBC, IRON, RETICCTPCT in the last 72 hours.   Radiology: ECHOCARDIOGRAM COMPLETE Result Date: 10/29/2023    ECHOCARDIOGRAM REPORT   Patient Name:   CORIN FORMISANO Date of Exam: 10/29/2023 Medical Rec #:  [id number]      Height:       60.0 in Accession #:    [id number]     Weight:       113.1 lb Date of Birth:  23-Sep-1955     BSA:          1.465 m Patient Age:    67 years       BP:           117/95 mmHg Patient Gender: F              HR:           105 bpm. Exam Location:  [facility] Procedure: 2D Echo, Cardiac Doppler and Color Doppler (Both Spectral and Color            Flow Doppler were utilized during procedure). Indications:     CHF  History:         Patient has no prior history of Echocardiogram examinations.                  COPD and TIA; Risk Factors:Hypertension.  Sonographer:     Thea Norlander Referring Phys:  [id number] DELAYNE LULLA SOLIAN Diagnosing Phys: Marsa Dooms MD IMPRESSIONS  1. Left ventricular ejection fraction, by estimation, is 60 to 65%. The left ventricle has normal function. The left ventricle has no regional wall motion abnormalities. There is severe concentric left ventricular hypertrophy. Left  ventricular diastolic  parameters are consistent with Grade I diastolic dysfunction (impaired relaxation).  2. Right ventricular systolic function is normal. The right ventricular size is normal.  3. The mitral valve is normal in structure. Mild to moderate mitral valve regurgitation. No evidence of mitral stenosis.  4. The aortic valve is normal in structure. Aortic valve regurgitation is mild. No aortic stenosis is present.  5. The inferior vena cava is normal in size with greater than 50% respiratory variability, suggesting right atrial pressure of 3 mmHg. FINDINGS  Left Ventricle: Left ventricular ejection fraction, by estimation, is 60 to 65%. The left ventricle has normal function. The left ventricle has no regional wall motion abnormalities. Strain was performed and the global longitudinal strain is indeterminate. The left ventricular internal cavity size was small. There is severe concentric left ventricular hypertrophy. Left ventricular diastolic parameters are consistent with Grade I diastolic dysfunction (impaired relaxation). Right Ventricle: The right ventricular size is normal. No increase in right ventricular wall thickness. Right ventricular systolic function is normal. Left Atrium: Left atrial size was normal in size. Right Atrium: Right atrial size was normal in size. Pericardium: There is no evidence of pericardial effusion. Mitral Valve: The mitral valve is normal in structure. Mild to moderate mitral valve regurgitation. No evidence of mitral valve stenosis. Tricuspid Valve: The tricuspid valve is normal in structure. Tricuspid valve regurgitation is not demonstrated. No evidence of tricuspid stenosis. Aortic Valve: The aortic valve is normal in structure. Aortic valve regurgitation is mild. No aortic stenosis is present. Aortic valve peak gradient measures 9.6 mmHg. Pulmonic Valve: The pulmonic valve  was normal in structure. Pulmonic valve regurgitation is not visualized. No evidence of  pulmonic stenosis. Aorta: The aortic root is normal in size and structure. Venous: The inferior vena cava is normal in size with greater than 50% respiratory variability, suggesting right atrial pressure of 3 mmHg. IAS/Shunts: No atrial level shunt detected by color flow Doppler. Additional Comments: 3D was performed not requiring image post processing on an independent workstation and was indeterminate.  LEFT VENTRICLE PLAX 2D LVIDd:         3.50 cm   Diastology LVIDs:         2.40 cm   LV e' medial:    4.03 cm/s LV PW:         1.80 cm   LV E/e' medial:  14.7 LV IVS:        1.60 cm   LV e' lateral:   5.22 cm/s LVOT diam:     2.00 cm   LV E/e' lateral: 11.3 LV SV:         48 LV SV Index:   33 LVOT Area:     3.14 cm  RIGHT VENTRICLE             IVC RV S prime:     23.10 cm/s  IVC diam: 0.80 cm TAPSE (M-mode): 1.8 cm LEFT ATRIUM             Index        RIGHT ATRIUM          Index LA diam:        4.50 cm 3.07 cm/m   RA Area:     9.50 cm LA Vol (A2C):   55.0 ml 37.54 ml/m  RA Volume:   17.50 ml 11.95 ml/m LA Vol (A4C):   57.8 ml 39.45 ml/m LA Biplane Vol: 61.3 ml 41.84 ml/m  AORTIC VALVE AV Area (Vmax): 2.35 cm AV Vmax:        155.00 cm/s AV Peak Grad:   9.6 mmHg LVOT Vmax:      116.00 cm/s LVOT Vmean:     70.700 cm/s LVOT VTI:       0.153 m  AORTA Ao Root diam: 3.50 cm Ao Asc diam:  3.40 cm MITRAL VALVE                TRICUSPID VALVE MV Area (PHT): 5.97 cm     TR Peak grad:   5.3 mmHg MV Decel Time: 127 msec     TR Vmax:        115.00 cm/s MR Peak grad: 141.8 mmHg MR Vmax:      595.33 cm/s   SHUNTS MV E velocity: 59.20 cm/s   Systemic VTI:  0.15 m MV A velocity: 103.00 cm/s  Systemic Diam: 2.00 cm MV E/A ratio:  0.57 Marsa Dooms MD Electronically signed by Marsa Dooms MD Signature Date/Time: 10/29/2023/12:22:52 PM    Final    DG Chest Portable 1 View Result Date: 10/29/2023 CLINICAL DATA:  COPD exacerbation EXAM: PORTABLE CHEST 1 VIEW COMPARISON:  S[age] teen 23 FINDINGS: Lung volumes are  small. Superimposed mild diffuse interstitial pulmonary infiltrate is present, new since prior examination most in keeping with mild interstitial pulmonary edema. No pneumothorax. Small left pleural effusion. Cardiac size within normal limits. No acute bone IMPRESSION: 1. Mild interstitial pulmonary edema. 2. Small left pleural effusion. Electronically Signed   By: Dorethia Molt M.D.   On: 10/29/2023 00:57    ECHO as above  TELEMETRY reviewed  by me 10/29/2023: Sinus tachycardia, rate 100s S/p bisoprolol  SR, rate 80s  EKG reviewed by me: Sinus tachycardia, rate 105 bpm with nonspecific ST-T wave changes.  Data reviewed by me 10/29/2023: last 24h vitals tele labs imaging I/O ED provider note, admission H&P.  Principal Problem:   Acute respiratory failure with hypoxia (HCC) Active Problems:   Hypertensive urgency   Acute exacerbation of CHF (congestive heart failure) (HCC)   Stage 3a chronic kidney disease (HCC)   History of TIA (transient ischemic attack)   COPD with acute exacerbation (HCC)   Hyperglycemia   Elevated troponin   Acute hypoxic respiratory failure (HCC)    ASSESSMENT AND PLAN:  Kimberly Fisher is a 68 y.o. female  with a past medical history of hypertension, hyperlipidemia, history of TIA in setting of hypertensive urgency (2022), COPD, tobacco use who presented to the ED on 10/28/2023 for worsening shortness of breath, abdominal bloating for the past week.  Patient also endorses chest pressure that radiates to her left arm and back that started yesterday that has been constant.  Patient denies any history of CAD, MI, HF or AF.  Patient states her mom died from a heart attack when she was 68 years old.  Cardiology was consulted for further evaluation.   # Acute HFpEF # Acute respiratory failure with hypoxia Patient with no known hx of HF. Presents with SOB, abdominal bloating. BNP elevated at 900.  CXR with small left pleural effusion and pulmonary vascular congestion. SOB has  improved s/p IV lasix  40 mg x2. Echo revealed pEF (60-65%), no RWMA, severe concentric LVH, grade I diastolic dysfunction, mild to mod MR. LA elevated at 3.7. Patient good UOP, warm/well perfused.  - Continue to trend lactate until < 2. - Monitor and replenish electrolytes for a goal K >4, Mag >2  - Continue IV Lasix  40 mg twice daily.  Closely monitor renal function, UOP. - Will initiate dapagliflozin tomorrow after LHC (copay $12.15). Losartan , bisoprolol  as stated below. - Consider cMRI to further evaluate infiltrative disease. Patient denies hx of biceps tendinitis or carpal tunnel syndrome.  # Chest pressure # Elevated troponin # Hypertension # Hyperlipidemia Patient with hx of noncompliance to medications, have not take BP meds for years. EKG with sinus tachycardia, rate 105 bpm with nonspecific ST-T wave changes.  Troponins mildly elevated and trending 50 > 79 > 100 > 123. Lipid panel with elevated LDL 138, chol 218. A1C pending. - Continue to trend troponins until peaked. - Continue aspirin  81 mg - Increased atorvastatin  to 80 mg daily. - Continue home amlodipine  5 mg daily. - Ordered losartan  50 mg daily. - Ordered bisoprolol  5 mg daily. Avoid uptitration until closer to euvolemia - Tentatively plan for [facility] tomorrow at 11:30 AM with Dr. Ammon fro chest pain, if renal function allows.   -Discussed the risks and benefits of proceeding with LHC for further evaluation with the patient.  She is agreeable to proceed.  NPO at midnight, LHC on 08/13 at 11:30 AM with Dr. Ammon.  Written consent will be obtained.  Further recommendations following LHC.    # COPD exacerbation # Tobacco use On 2L O2 with stable SpO2, no O2 requirement at home.  - Management per primary team.  This patient's plan of care was discussed and created with Dr. Ammon and he is in agreement.  Signed: Dorene Comfort, PA-C  10/29/2023, 1:22 PM [facility]

## 2023-10-29 NOTE — Assessment & Plan Note (Signed)
 -  Renal function  at baseline

## 2023-10-29 NOTE — Progress Notes (Signed)
 Echocardiogram 2D Echocardiogram has been performed.  Kimberly Fisher 10/29/2023, 9:35 AM

## 2023-10-29 NOTE — Assessment & Plan Note (Signed)
 Scheduled and as needed nebulized bronchodilators IV steroids Antitussives, flutter valve Supplemental O2 as above

## 2023-10-29 NOTE — Assessment & Plan Note (Signed)
 Troponin 50--> 79, without chest pain or ischemic EKG findings Suspect demand ischemia Echocardiogram to evaluate for focal wall motion abnormality

## 2023-10-29 NOTE — Plan of Care (Signed)
 Patient was admitted overnight due to acute respiratory failure secondary to CHF.  TTE shows severe concentric LV hypertrophy.  Diastolic dysfunction.  Continue diuresis with Lasix , patient was started on beta-blocker and ARBs by cardiology Elevated troponin, most likely demand ischemia As per cardiology patient will be scheduled for cardiac catheter most likely tomorrow a.m. Keep in after midnight  Urinary retention, Foley catheter was placed and 450 mL urine was collected.  Continue Foley catheter until patient is ambulatory and then do the voiding trial  Patient was admitted after midnight, H&P reviewed. No charge plan of care note.

## 2023-10-29 NOTE — Assessment & Plan Note (Signed)
 No acute issues.

## 2023-10-29 NOTE — Inpatient Diabetes Management (Signed)
 Inpatient Diabetes Program Recommendations  AACE/ADA: New Consensus Statement on Inpatient Glycemic Control   Target Ranges:  Prepandial:   less than 140 mg/dL      Peak postprandial:   less than 180 mg/dL (1-2 hours)      Critically ill patients:  140 - 180 mg/dL    Latest Reference Range & Units 10/29/23 08:05 10/29/23 12:14  Glucose-Capillary 70 - 99 mg/dL 752 (H) 689 (H)    Latest Reference Range & Units 10/28/23 23:09 10/29/23 10:18  Glucose 70 - 99 mg/dL 778 (H) 771 (H)    Latest Reference Range & Units 10/04/20 04:21  Hemoglobin A1C 4.8 - 5.6 % 5.8 (H)   Review of Glycemic Control  Diabetes history: No Outpatient Diabetes medications: NA Current orders for Inpatient glycemic control: Novolog  0-9 units TID with meals, Novolog  0-5 units at bedtime; Solumedrol 40 mg Q12H, Prednisone  40 mg QAM (start 10/30/23)  Inpatient Diabetes Program Recommendations:    Insulin :  If steroids are continued as ordered, please consider ordering Semglee 5 units Q24H and Novolog  3 units TID with meals for meal coverage if patient eats at least 50% of meals.  HbgA1C: Current A1C in process.  Thanks, Earnie Gainer, RN, MSN, CDCES Diabetes Coordinator Inpatient Diabetes Program [phone] (Team Pager from 8am to 5pm)

## 2023-10-29 NOTE — Assessment & Plan Note (Signed)
 Hypertensive urgency/emergency No prior history of CHF Continue Nitropaste for BP control and CHF management IV Lasix  Continue home lisinopril  and amlodipine 

## 2023-10-29 NOTE — ED Notes (Signed)
 Pt assisted onto [facility].

## 2023-10-29 NOTE — Plan of Care (Signed)
 Patient still having some residual chest pain.  Vitals remained stable.  Daughter at bedside.

## 2023-10-29 NOTE — Telephone Encounter (Signed)
 Patient Product/process development scientist completed.    The patient is insured through [employer]. Patient has Medicare and is not eligible for a copay card, but may be able to apply for patient assistance or Medicare RX Payment Plan (Patient Must reach out to their plan, if eligible for payment plan), if available.    Ran test claim for Farxiga 10 mg and the current 30 day co-pay is $12.15.   This test claim was processed through [facility]- copay amounts may vary at other pharmacies due to pharmacy/plan contracts, or as the patient moves through the different stages of their insurance plan.     Morgan Arab, CPHT Pharmacy Technician III Certified Patient Advocate [facility] Patient Advocate Team Direct Number: [phone]  Fax: [phone]

## 2023-10-30 ENCOUNTER — Encounter
Admission: EM | Disposition: A | Discharge status: Home / Self Care | Payer: Self-pay | Source: Home / Self Care | Attending: Student

## 2023-10-30 DIAGNOSIS — J9601 Acute respiratory failure with hypoxia: Secondary | ICD-10-CM | POA: Diagnosis not present

## 2023-10-30 LAB — BASIC METABOLIC PANEL WITH GFR
Anion gap: 12 (ref 5–15)
BUN: 36 mg/dL — ABNORMAL HIGH (ref 8–23)
CO2: 25 mmol/L (ref 22–32)
Calcium: 8.9 mg/dL (ref 8.9–10.3)
Chloride: 102 mmol/L (ref 98–111)
Creatinine, Ser: 1.57 mg/dL — ABNORMAL HIGH (ref 0.44–1.00)
GFR, Estimated: 36 mL/min — ABNORMAL LOW (ref >60)
Glucose, Bld: 152 mg/dL — ABNORMAL HIGH (ref 70–99)
Potassium: 4.1 mmol/L (ref 3.5–5.1)
Sodium: 139 mmol/L (ref 135–145)

## 2023-10-30 LAB — CBC
HCT: 35.9 % — ABNORMAL LOW (ref 36.0–46.0)
Hemoglobin: 11.6 g/dL — ABNORMAL LOW (ref 12.0–15.0)
MCH: 29.4 pg (ref 26.0–34.0)
MCHC: 32.3 g/dL (ref 30.0–36.0)
MCV: 91.1 fL (ref 80.0–100.0)
Platelets: 256 K/uL (ref 150–400)
RBC: 3.94 MIL/uL (ref 3.87–5.11)
RDW: 13.4 % (ref 11.5–15.5)
WBC: 15.8 K/uL — ABNORMAL HIGH (ref 4.0–10.5)
nRBC: 0 % (ref 0.0–0.2)

## 2023-10-30 LAB — LACTIC ACID, PLASMA
Lactic Acid, Venous: 1.5 mmol/L (ref 0.5–1.9)
Lactic Acid, Venous: 1.8 mmol/L (ref 0.5–1.9)

## 2023-10-30 LAB — GLUCOSE, CAPILLARY
Glucose-Capillary: 143 mg/dL — ABNORMAL HIGH (ref 70–99)
Glucose-Capillary: 158 mg/dL — ABNORMAL HIGH (ref 70–99)
Glucose-Capillary: 139 mg/dL — ABNORMAL HIGH (ref 70–99)
Glucose-Capillary: 173 mg/dL — ABNORMAL HIGH (ref 70–99)

## 2023-10-30 LAB — HEPATIC FUNCTION PANEL
ALT: 18 U/L (ref 0–44)
AST: 19 U/L (ref 15–41)
Albumin: 3.1 g/dL — ABNORMAL LOW (ref 3.5–5.0)
Alkaline Phosphatase: 66 U/L (ref 38–126)
Bilirubin, Direct: <0.1 mg/dL (ref 0.0–0.2)
Total Bilirubin: 0.4 mg/dL (ref 0.0–1.2)
Total Protein: 6.3 g/dL — ABNORMAL LOW (ref 6.5–8.1)

## 2023-10-30 LAB — HEMOGLOBIN A1C
Hgb A1c MFr Bld: 5.4 % (ref 4.8–5.6)
Mean Plasma Glucose: 108 mg/dL

## 2023-10-30 LAB — MAGNESIUM: Magnesium: 2.3 mg/dL (ref 1.7–2.4)

## 2023-10-30 LAB — PHOSPHORUS: Phosphorus: 4.6 mg/dL (ref 2.5–4.6)

## 2023-10-30 SURGERY — LEFT HEART CATH AND CORONARY ANGIOGRAPHY
Anesthesia: Moderate Sedation

## 2023-10-30 MED ORDER — DOCUSATE SODIUM 100 MG PO CAPS
100.0000 mg | ORAL_CAPSULE | Freq: Every day | ORAL | Status: DC
  Administered 2023-10-30 – 2023-11-02 (×4): 100 mg via ORAL
  Filled 2023-10-30 (×4): qty 1

## 2023-10-30 MED ORDER — LOSARTAN POTASSIUM 50 MG PO TABS
50.0000 mg | ORAL_TABLET | Freq: Every day | ORAL | Status: DC
  Administered 2023-10-30 – 2023-11-01 (×4): 50 mg via ORAL
  Filled 2023-10-30 (×3): qty 1

## 2023-10-30 MED ORDER — MELATONIN 5 MG PO TABS
5.0000 mg | ORAL_TABLET | Freq: Every day | ORAL | Status: DC
  Administered 2023-10-31 – 2023-11-01 (×2): 5 mg via ORAL
  Filled 2023-10-30 (×2): qty 1

## 2023-10-30 MED ORDER — ISOSORBIDE MONONITRATE ER 60 MG PO TB24
60.0000 mg | ORAL_TABLET | Freq: Every day | ORAL | Status: DC
  Administered 2023-10-30 – 2023-10-31 (×3): 60 mg via ORAL
  Filled 2023-10-30 (×2): qty 1

## 2023-10-30 MED ORDER — MELATONIN 5 MG PO TABS: 5.0000 mg | ORAL_TABLET | Freq: Every day | ORAL | Status: DC

## 2023-10-30 MED ORDER — BISACODYL 5 MG PO TBEC: 10.0000 mg | DELAYED_RELEASE_TABLET | Freq: Every day | ORAL | Status: DC

## 2023-10-30 MED ORDER — METHYLPREDNISOLONE SODIUM SUCC 40 MG IJ SOLR
40.0000 mg | Freq: Once | INTRAMUSCULAR | Status: AC
  Administered 2023-10-30 (×2): 40 mg via INTRAVENOUS
  Filled 2023-10-30: qty 1

## 2023-10-30 MED ORDER — IPRATROPIUM-ALBUTEROL 0.5-2.5 (3) MG/3ML IN SOLN: 3.0000 mL | Freq: Four times a day (QID) | RESPIRATORY_TRACT | Status: DC | PRN

## 2023-10-30 MED ORDER — MELATONIN 5 MG PO TABS
5.0000 mg | ORAL_TABLET | Freq: Once | ORAL | Status: AC
  Administered 2023-10-30 (×2): 5 mg via ORAL
  Filled 2023-10-30: qty 1

## 2023-10-30 MED ORDER — ENOXAPARIN SODIUM 30 MG/0.3ML IJ SOSY
30.0000 mg | PREFILLED_SYRINGE | INTRAMUSCULAR | Status: DC
  Administered 2023-10-31 – 2023-11-02 (×3): 30 mg via SUBCUTANEOUS
  Filled 2023-10-30 (×3): qty 0.3

## 2023-10-30 MED ORDER — BISACODYL 10 MG RE SUPP
10.0000 mg | Freq: Every day | RECTAL | Status: DC | PRN
  Administered 2023-11-02: 10 mg via RECTAL
  Filled 2023-10-30: qty 1

## 2023-10-30 MED ORDER — BISACODYL 5 MG PO TBEC
10.0000 mg | DELAYED_RELEASE_TABLET | Freq: Once | ORAL | Status: DC
  Filled 2023-10-30: qty 2

## 2023-10-30 MED ORDER — LOSARTAN POTASSIUM 50 MG PO TABS: 75.0000 mg | ORAL_TABLET | Freq: Every day | ORAL | Status: DC

## 2023-10-30 MED ORDER — POLYETHYLENE GLYCOL 3350 17 G PO PACK
17.0000 g | PACK | Freq: Two times a day (BID) | ORAL | Status: DC
  Administered 2023-10-30 – 2023-11-02 (×5): 17 g via ORAL
  Filled 2023-10-30 (×8): qty 1

## 2023-10-30 NOTE — Progress Notes (Signed)
 PHARMACIST - PHYSICIAN COMMUNICATION  CONCERNING:  Enoxaparin  (Lovenox ) for DVT Prophylaxis    RECOMMENDATION: Patient was prescribed enoxaprin 40mg  q24 hours for VTE prophylaxis.   Filed Weights   10/29/23 0345 10/30/23 0500  Weight: 51.3 kg (113 lb 1.5 oz) 51 kg (112 lb 7 oz)    Body mass index is 21.96 kg/m.  Estimated Creatinine Clearance: 25.3 mL/min (A) (by C-G formula based on SCr of 1.57 mg/dL (H)).   Based on [facility] policy patient is candidate for enoxaparin  30mg  every 24 hours based on CrCl <48ml/min or Weight <45kg  DESCRIPTION: Pharmacy has adjusted enoxaparin  dose per [facility] policy.  Patient is now receiving enoxaparin  30 mg every 24 hours   Orpha Dain Rodriguez-Guzman PharmD, BCPS 10/30/2023 10:39 AM

## 2023-10-30 NOTE — Progress Notes (Cosign Needed Addendum)
 [facility] CLINIC CARDIOLOGY PROGRESS NOTE       Patient ID: Kimberly Fisher MRN: [id number] DOB/AGE: October 04, 1955 68 y.o.  Admit date: 10/28/2023 Referring Physician Dr. Cleatus Primary Physician Pcp, No Primary Cardiologist None Reason for Consultation acute CHF  HPI: Kimberly Fisher is a 68 y.o. female  with a past medical history of hypertension, hyperlipidemia, history of TIA in setting of hypertensive urgency (2022), COPD, tobacco use who presented to the ED on 10/28/2023 for worsening shortness of breath, abdominal bloating for the past week.  Patient also endorses chest pressure that radiates to her left arm and back that started yesterday that has been constant.  Patient denies any history of CAD, MI, HF or AF.  Patient states her mom died from a heart attack when she was 68 years old.  Cardiology was consulted for further evaluation.   Interval History: -Patient seen and examined this AM and laying comfortably in hospital bed, nearly flat. Patient states breathing is much improved. States chest pressure has overall improved, now intermittent/mild. -Pt appears near euvolemic.  -Patients BP elevated and HR stable this AM. Overnight Tele showed no significant events.  -Yesterday UOP 1.45L, with worsening renal function. -Patient remains on room air  with stable SpO2.  -Lactate normalized. -Cancelled LHC today due to worsening renal function, risk of contrast induced nephropathy.   Review of systems complete and found to be negative unless listed above    Past Medical History:  Diagnosis Date   Anxiety    Asthma    Cardiomegaly    COPD (chronic obstructive pulmonary disease) (HCC)    GERD (gastroesophageal reflux disease)    Hypertension    Low back pain    Mitral valve insufficiency    Nicotine  dependence    Obesity     Past Surgical History:  Procedure Laterality Date   CESAREAN SECTION     COLONOSCOPY WITH PROPOFOL  N/A 07/18/2015   Procedure: COLONOSCOPY WITH PROPOFOL ;   Surgeon: Donnice Vaughn Manes, MD;  Location: [facility] ENDOSCOPY;  Service: Endoscopy;  Laterality: N/A;   ESOPHAGOGASTRODUODENOSCOPY (EGD) WITH PROPOFOL  N/A 07/18/2015   Procedure: ESOPHAGOGASTRODUODENOSCOPY (EGD) WITH PROPOFOL ;  Surgeon: Donnice Vaughn Manes, MD;  Location: [facility] ENDOSCOPY;  Service: Endoscopy;  Laterality: N/A;   Essential Tubal Ligation     TONSILLECTOMY      Medications Prior to Admission  Medication Sig Dispense Refill Last Dose/Taking   albuterol  (VENTOLIN  HFA) 108 (90 Base) MCG/ACT inhaler Inhale 1-2 puffs into the lungs every 4 (four) hours as needed for wheezing or shortness of breath. 1 each 0 Taking As Needed   amLODipine  (NORVASC ) 5 MG tablet Take 1 tablet (5 mg total) by mouth daily. (Patient not taking: Reported on 10/29/2023) 30 tablet 2 Not Taking   aspirin  EC 81 MG EC tablet Take 1 tablet (81 mg total) by mouth daily. Swallow whole. (Patient not taking: Reported on 10/29/2023) 30 tablet 11 Not Taking   atorvastatin  (LIPITOR ) 40 MG tablet Take 1 tablet (40 mg total) by mouth daily. (Patient not taking: Reported on 10/29/2023) 30 tablet 0 Not Taking   budesonide -formoterol  (SYMBICORT ) 160-4.5 MCG/ACT inhaler Inhale 2 puffs into the lungs 4 (four) times daily. (Patient not taking: Reported on 10/29/2023) 1 each 0 Not Taking   hydrocortisone -pramoxine (PROCTOFOAM-HC) rectal foam Place 1 applicator rectally 2 (two) times daily. (Patient not taking: Reported on 10/29/2023) 10 g 1 Not Taking   lisinopril  (ZESTRIL ) 10 MG tablet Take 1 tablet (10 mg total) by mouth daily. (Patient not taking: Reported on 10/29/2023)  30 tablet 3 Not Taking   loperamide  (IMODIUM  A-D) 2 MG tablet Take 1 tablet (2 mg total) by mouth 4 (four) times daily as needed for diarrhea or loose stools. (Patient not taking: Reported on 10/29/2023) 30 tablet 0 Not Taking   nicotine  (NICODERM CQ  - DOSED IN MG/24 HOURS) 21 mg/24hr patch Place 1 patch (21 mg total) onto the skin daily. (Patient not taking: Reported on  10/29/2023) 28 patch 0 Not Taking   phenylephrine-shark liver oil-mineral oil-petrolatum (PREPARATION H) 0.25-14-74.9 % rectal ointment Place 1 Application rectally 2 (two) times daily as needed for hemorrhoids. (Patient not taking: Reported on 10/29/2023) 28 g 0 Not Taking   Social History   Socioeconomic History   Marital status: Widowed    Spouse name: Not on file   Number of children: Not on file   Years of education: Not on file   Highest education level: Not on file  Occupational History   Not on file  Tobacco Use   Smoking status: Every Day    Current packs/day: 1.00    Types: Cigarettes   Smokeless tobacco: Never  Substance and Sexual Activity   Alcohol use: No   Drug use: No   Sexual activity: Not on file  Other Topics Concern   Not on file  Social History Narrative   Not on file   Social Drivers of Health   Financial Resource Strain: Not on file  Food Insecurity: No Food Insecurity (10/29/2023)   Hunger Vital Sign    Worried About Running Out of Food in the Last Year: Never true    Ran Out of Food in the Last Year: Never true  Transportation Needs: No Transportation Needs (10/29/2023)   PRAPARE - Administrator, Civil Service (Medical): No    Lack of Transportation (Non-Medical): No  Physical Activity: Not on file  Stress: Not on file  Social Connections: Unknown (10/29/2023)   Social Connection and Isolation Panel    Frequency of Communication with Friends and Family: More than three times a week    Frequency of Social Gatherings with Friends and Family: More than three times a week    Attends Religious Services: More than 4 times per year    Active Member of [employer] or Organizations: Patient declined    Attends Banker Meetings: Patient declined    Marital Status: Patient declined  Intimate Partner Violence: Not At Risk (10/29/2023)   Humiliation, Afraid, Rape, and Kick questionnaire    Fear of Current or Ex-Partner: No    Emotionally  Abused: No    Physically Abused: No    Sexually Abused: No    History reviewed. No pertinent family history.   Vitals:   10/30/23 0428 10/30/23 0500 10/30/23 0733 10/30/23 0739  BP: (!) 151/83  (!) 167/83   Pulse: 66  64   Resp: 20  18   Temp: 98.6 F (37 C)  97.7 F (36.5 C)   TempSrc:      SpO2:   94% 94%  Weight:  51 kg    Height:        PHYSICAL EXAM General: Moderately ill-appearing elderly female, well nourished, in no acute distress. HEENT: Normocephalic and atraumatic. Neck: No JVD.   Lungs: Normal respiratory effort on room air.  CTAB Heart: HRR. Normal S1 and S2 without gallops or murmurs.  Abdomen: Non-distended appearing.  Msk: Normal strength and tone for age. Extremities: Warm and well perfused. No clubbing, cyanosis, edema.  Neuro: Alert  and oriented X 3. Psych: Answers questions appropriately.   Labs: Basic Metabolic Panel: Recent Labs    10/28/23 2309 10/29/23 1018 10/30/23 0529  NA 140 140 139  K 3.8 3.5 4.1  CL 109 103 102  CO2 21* 21* 25  GLUCOSE 221* 228* 152*  BUN 22 22 36*  CREATININE 1.15* 1.23* 1.57*  CALCIUM  8.5* 8.8* 8.9  MG 3.8*  --  2.3  PHOS  --  2.6 4.6   Liver Function Tests: Recent Labs    10/28/23 2309 10/30/23 0529  AST 45* 19  ALT 27 18  ALKPHOS 78 66  BILITOT 0.4 0.4  PROT 6.5 6.3*  ALBUMIN 3.2* 3.1*   No results for input(s): LIPASE, AMYLASE in the last 72 hours. CBC: Recent Labs    10/28/23 2309 10/29/23 1018 10/30/23 0529  WBC 7.7 8.2 15.8*  NEUTROABS 5.0  --   --   HGB 12.3 12.1 11.6*  HCT 38.6 38.5 35.9*  MCV 92.8 92.3 91.1  PLT 271 250 256   Cardiac Enzymes: Recent Labs    10/29/23 1015 10/29/23 1218 10/29/23 1435  TROPONINIHS 100* 123* 125*   BNP: Recent Labs    10/28/23 2309  BNP 900.3*   D-Dimer: No results for input(s): DDIMER in the last 72 hours. Hemoglobin A1C: No results for input(s): HGBA1C in the last 72 hours. Fasting Lipid Panel: Recent Labs    10/29/23 1018   CHOL 218*  HDL 72  LDLCALC 138*  TRIG 41  CHOLHDL 3.0   Thyroid  Function Tests: No results for input(s): TSH, T4TOTAL, T3FREE, THYROIDAB in the last 72 hours.  Invalid input(s): FREET3 Anemia Panel: No results for input(s): VITAMINB12, FOLATE, FERRITIN, TIBC, IRON, RETICCTPCT in the last 72 hours.   Radiology: ECHOCARDIOGRAM COMPLETE Result Date: 10/29/2023    ECHOCARDIOGRAM REPORT   Patient Name:   Kimberly Fisher Date of Exam: 10/29/2023 Medical Rec #:  [id number]      Height:       60.0 in Accession #:    [id number]     Weight:       113.1 lb Date of Birth:  Jan 02, 1956     BSA:          1.465 m Patient Age:    67 years       BP:           117/95 mmHg Patient Gender: F              HR:           105 bpm. Exam Location:  [facility] Procedure: 2D Echo, Cardiac Doppler and Color Doppler (Both Spectral and Color            Flow Doppler were utilized during procedure). Indications:     CHF  History:         Patient has no prior history of Echocardiogram examinations.                  COPD and TIA; Risk Factors:Hypertension.  Sonographer:     Thea Norlander Referring Phys:  [id number] DELAYNE LULLA SOLIAN Diagnosing Phys: Marsa Dooms MD IMPRESSIONS  1. Left ventricular ejection fraction, by estimation, is 60 to 65%. The left ventricle has normal function. The left ventricle has no regional wall motion abnormalities. There is severe concentric left ventricular hypertrophy. Left ventricular diastolic  parameters are consistent with Grade I diastolic dysfunction (impaired relaxation).  2. Right ventricular systolic function is normal. The right ventricular size is normal.  3. The mitral valve is normal in structure. Mild to moderate mitral valve regurgitation. No evidence of mitral stenosis.  4. The aortic valve is normal in structure. Aortic valve regurgitation is mild. No aortic stenosis is present.  5. The inferior vena cava is normal in size with greater than 50% respiratory variability,  suggesting right atrial pressure of 3 mmHg. FINDINGS  Left Ventricle: Left ventricular ejection fraction, by estimation, is 60 to 65%. The left ventricle has normal function. The left ventricle has no regional wall motion abnormalities. Strain was performed and the global longitudinal strain is indeterminate. The left ventricular internal cavity size was small. There is severe concentric left ventricular hypertrophy. Left ventricular diastolic parameters are consistent with Grade I diastolic dysfunction (impaired relaxation). Right Ventricle: The right ventricular size is normal. No increase in right ventricular wall thickness. Right ventricular systolic function is normal. Left Atrium: Left atrial size was normal in size. Right Atrium: Right atrial size was normal in size. Pericardium: There is no evidence of pericardial effusion. Mitral Valve: The mitral valve is normal in structure. Mild to moderate mitral valve regurgitation. No evidence of mitral valve stenosis. Tricuspid Valve: The tricuspid valve is normal in structure. Tricuspid valve regurgitation is not demonstrated. No evidence of tricuspid stenosis. Aortic Valve: The aortic valve is normal in structure. Aortic valve regurgitation is mild. No aortic stenosis is present. Aortic valve peak gradient measures 9.6 mmHg. Pulmonic Valve: The pulmonic valve was normal in structure. Pulmonic valve regurgitation is not visualized. No evidence of pulmonic stenosis. Aorta: The aortic root is normal in size and structure. Venous: The inferior vena cava is normal in size with greater than 50% respiratory variability, suggesting right atrial pressure of 3 mmHg. IAS/Shunts: No atrial level shunt detected by color flow Doppler. Additional Comments: 3D was performed not requiring image post processing on an independent workstation and was indeterminate.  LEFT VENTRICLE PLAX 2D LVIDd:         3.50 cm   Diastology LVIDs:         2.40 cm   LV e' medial:    4.03 cm/s LV PW:          1.80 cm   LV E/e' medial:  14.7 LV IVS:        1.60 cm   LV e' lateral:   5.22 cm/s LVOT diam:     2.00 cm   LV E/e' lateral: 11.3 LV SV:         48 LV SV Index:   33 LVOT Area:     3.14 cm  RIGHT VENTRICLE             IVC RV S prime:     23.10 cm/s  IVC diam: 0.80 cm TAPSE (M-mode): 1.8 cm LEFT ATRIUM             Index        RIGHT ATRIUM          Index LA diam:        4.50 cm 3.07 cm/m   RA Area:     9.50 cm LA Vol (A2C):   55.0 ml 37.54 ml/m  RA Volume:   17.50 ml 11.95 ml/m LA Vol (A4C):   57.8 ml 39.45 ml/m LA Biplane Vol: 61.3 ml 41.84 ml/m  AORTIC VALVE AV Area (Vmax): 2.35 cm AV Vmax:        155.00 cm/s AV Peak Grad:   9.6 mmHg LVOT Vmax:      116.00  cm/s LVOT Vmean:     70.700 cm/s LVOT VTI:       0.153 m  AORTA Ao Root diam: 3.50 cm Ao Asc diam:  3.40 cm MITRAL VALVE                TRICUSPID VALVE MV Area (PHT): 5.97 cm     TR Peak grad:   5.3 mmHg MV Decel Time: 127 msec     TR Vmax:        115.00 cm/s MR Peak grad: 141.8 mmHg MR Vmax:      595.33 cm/s   SHUNTS MV E velocity: 59.20 cm/s   Systemic VTI:  0.15 m MV A velocity: 103.00 cm/s  Systemic Diam: 2.00 cm MV E/A ratio:  0.57 Marsa Dooms MD Electronically signed by Marsa Dooms MD Signature Date/Time: 10/29/2023/12:22:52 PM    Final    DG Chest Portable 1 View Result Date: 10/29/2023 CLINICAL DATA:  COPD exacerbation EXAM: PORTABLE CHEST 1 VIEW COMPARISON:  S[age] teen 23 FINDINGS: Lung volumes are small. Superimposed mild diffuse interstitial pulmonary infiltrate is present, new since prior examination most in keeping with mild interstitial pulmonary edema. No pneumothorax. Small left pleural effusion. Cardiac size within normal limits. No acute bone IMPRESSION: 1. Mild interstitial pulmonary edema. 2. Small left pleural effusion. Electronically Signed   By: Dorethia Molt M.D.   On: 10/29/2023 00:57    ECHO as above  TELEMETRY reviewed by me 10/30/2023: Sinus rhythm, rate 60s  EKG reviewed by me: Sinus  tachycardia, rate 105 bpm with nonspecific ST-T wave changes.  Data reviewed by me 10/30/2023: last 24h vitals tele labs imaging I/O hospitalist progress notes.  Principal Problem:   Acute respiratory failure with hypoxia (HCC) Active Problems:   Hypertensive urgency   Acute exacerbation of CHF (congestive heart failure) (HCC)   Stage 3a chronic kidney disease (HCC)   History of TIA (transient ischemic attack)   COPD with acute exacerbation (HCC)   Hyperglycemia   Elevated troponin   Acute hypoxic respiratory failure (HCC)    ASSESSMENT AND PLAN:  Kimberly Fisher is a 68 y.o. female  with a past medical history of hypertension, hyperlipidemia, history of TIA in setting of hypertensive urgency (2022), COPD, tobacco use who presented to the ED on 10/28/2023 for worsening shortness of breath, abdominal bloating for the past week.  Patient also endorses chest pressure that radiates to her left arm and back that started yesterday that has been constant.  Patient denies any history of CAD, MI, HF or AF.  Patient states her mom died from a heart attack when she was 68 years old.  Cardiology was consulted for further evaluation.   # Acute HFpEF # Acute respiratory failure with hypoxia # AKI Patient with no known hx of HF. Presents with SOB, abdominal bloating. BNP elevated at 900.  CXR with small left pleural effusion and pulmonary vascular congestion. SOB has improved s/p IV lasix  40 mg x2. Echo revealed pEF (60-65%), no RWMA, severe concentric LVH, grade I diastolic dysfunction, mild to mod MR. LA normalized. Patient appears near euvolemic.  - Monitor and replenish electrolytes for a goal K >4, Mag >2  - Discontinued IV Lasix  due to worsening renal function. Will consider PO lasix  20 mg tomorrow if renal function allows. - Will initiate dapagliflozin when renal function normalizes.(copay $12.15). Losartan , bisoprolol  as stated below. - Consider cMRI to further evaluate infiltrative disease.  Patient denies hx of biceps tendinitis or carpal tunnel syndrome.  #  Intermittent chest pressure # Hypertension # Hyperlipidemia # Demand ischemia, setting of AoCHFpEF, respiratory distress Patient with hx of noncompliance to medications, have not take BP meds for years. EKG with sinus tachycardia, rate 105 bpm with nonspecific ST-T wave changes.  Troponins mildly elevated and flat 50 > 79 > 100 > 123 > 125. Lipid panel with elevated LDL 138, chol 218. Chest pressure much improved. -Cancelled LHC today due to worsening renal function, risk of contrast induced nephropathy. This can be done outpatient when renal function normalizes. - Continue aspirin  81 mg - Continue atorvastatin  to 80 mg daily. - Continue home amlodipine  5 mg daily. - Continue losartan  50 mg daily. - Continue bisoprolol  5 mg daily.  - Ordered Imdur  60 mg daily.   # COPD exacerbation # Tobacco use On 2L O2 with stable SpO2, no O2 requirement at home.  - Management per primary team.  This patient's plan of care was discussed and created with Dr. Ammon and he is in agreement.  Signed: Dorene Comfort, PA-C  10/30/2023, 8:24 AM [facility]

## 2023-10-30 NOTE — Plan of Care (Signed)

## 2023-10-30 NOTE — Progress Notes (Signed)
 [facility] Progress Note  Patient: Kimberly Fisher    FMW:[id number]  DOA: 10/28/2023     Date of Service: the patient was seen and examined on 10/30/2023  Chief Complaint  Patient presents with   Shortness of Breath   Brief hospital course: Kimberly Fisher is a 68 y.o. female with medical history significant for HTN, COPD, TIA 2022 in the setting of hypertensive urgency, being admitted with new onset CHF as well as possible COPD.  She arrived by EMS on CPAP after being called out for respiratory distress.  She denied chest pain, fever or chills.  She endorses orthopnea.  En route she received DuoNebs, and magnesium. In the ED, hypertensive to 231/119, tachycardic to 105, tachypneic to the mid 20s, transitioned to O2 at 6 L on arrival, maintaining sats in the mid 90s. Labs notable for troponin 50 and BNP 900 CBC WNL CMP notable for hyperglycemia of 221, Baseline creatinine of 1.15 EKG showing sinus tachycardia at 105 ST depression with T wave inversion inferolateral leads Chest x-ray with mild interstitial pulmonary edema and small left pleural effusion Patient treated with DuoNebs and Solu-Medrol  and placed on Nitropaste and given a dose of Lasix  Admission requested    Assessment and Plan:  Acute respiratory failure with hypoxia (HCC) Patient arrived on CPAP by EMS.  O2 sat reportedly 32% on room air with EMS Likely secondary to a combination of CHF and COPD S/p BiPAP, which has been weaned off Currently saturating well on room air.  Respiratory failure resolved   COPD with acute exacerbation (HCC) Scheduled and as needed nebulized bronchodilators S/p IV steroids, followed by prednisone  40 mg p.o. for 4 days Antitussives, flutter valve Supplemental O2 as above   Acute exacerbation of dCHF (congestive heart failure) Hypertensive urgency/emergency No prior history of CHF S/p Nitropaste for BP control and CHF management IV Lasix  Continue home Amlodipine , DC'd lisinopril  and  started losartan  50 mg p.o. daily.  Started Imdur  60 mg p.o. daily TTE shows LVEF 65%, severe concentric LV hypertrophy and grade 1 diastolic dysfunction S/p IV Lasix  given for diuresis 8/13 stopped Lasix  for now due to elevated creatinine Patient is euvolemic currently. Monitor renal function and urine output Cardiology consulted, recommended cardiac cath after renal function improvement, can be done as an outpatient. Continue to monitor on telemetry   # Elevated troponin Troponin 50--> 79, without chest pain or ischemic EKG findings Suspect demand ischemia TTE as above  Cardiology consulted, recommend cardiac cath after infection improvement. Started Imdur  60 mg pod    Hyperglycemia Random blood glucose 221. No prior history of diabetes We will get A1c to [facility] for diabetes Sliding scale coverage   Stage 3a chronic kidney disease (HCC) Renal function at baseline 8/13 creatinine 1.57 slightly elevated Monitor renal functions daily   History of TIA (transient ischemic attack) No acute issues Continue aspirin  and statin   # Urinary retention, Foley catheter was inserted on 8/12 and 450 mL urine was collected.  Continue Foley catheter until patient is ambulatory and then do the voiding trial   # Constipation, started laxatives  Body mass index is 21.96 kg/m.  Interventions:  Diet: Heart healthy/carb modified diet, fluid restriction 1.5 L/day  DVT Prophylaxis: Subcutaneous Lovenox    Advance goals of care discussion: Full code  Family Communication: family was present at bedside, at the time of interview.  The pt provided permission to discuss medical plan with the family. Opportunity was given to ask question and all questions were answered satisfactorily.  Disposition:  Pt is from Home, admitted with CHF, still has SOB, which precludes a safe discharge. Discharge to home, when stable and cleared by cardiology, may need few days to improve.  Subjective: No  significant events overnight, patient breathing is getting better, still has not ambulated in the hallway.  Still has chest pain off-and-on on the left side, currently pain is 7/10.   Physical Exam: General: NAD, lying comfortably Appear in no distress, affect appropriate Eyes: PERRLA ENT: Oral Mucosa Clear, moist  Neck: no JVD,  Cardiovascular: S1 and S2 Present, no Murmur,  Respiratory: good respiratory effort, Bilateral Air entry equal and Decreased, no Crackles, no wheezes Abdomen: Bowel Sound present, Soft and no tenderness,  Skin: no rashes Extremities: no Pedal edema, no calf tenderness Neurologic: without any new focal findings Gait not checked due to patient safety concerns  Vitals:   10/30/23 0733 10/30/23 0739 10/30/23 1342 10/30/23 1624  BP: (!) 167/83  122/66 122/70  Pulse: 64  75 62  Resp: 18  16 16   Temp: 97.7 F (36.5 C)  98.5 F (36.9 C) 98.3 F (36.8 C)  TempSrc:      SpO2: 94% 94% 92% 92%  Weight:      Height:        Intake/Output Summary (Last 24 hours) at 10/30/2023 1822 Last data filed at 10/30/2023 1500 Gross per 24 hour  Intake 465.5 ml  Output 350 ml  Net 115.5 ml   Filed Weights   10/29/23 0345 10/30/23 0500  Weight: 51.3 kg 51 kg    Data Reviewed: I have personally reviewed and interpreted daily labs, tele strips, imagings as discussed above. I reviewed all nursing notes, pharmacy notes, vitals, pertinent old records I have discussed plan of care as described above with RN and patient/family.  CBC: Recent Labs  Lab 10/28/23 2309 10/29/23 1018 10/30/23 0529  WBC 7.7 8.2 15.8*  NEUTROABS 5.0  --   --   HGB 12.3 12.1 11.6*  HCT 38.6 38.5 35.9*  MCV 92.8 92.3 91.1  PLT 271 250 256   Basic Metabolic Panel: Recent Labs  Lab 10/28/23 2309 10/29/23 1018 10/30/23 0529  NA 140 140 139  K 3.8 3.5 4.1  CL 109 103 102  CO2 21* 21* 25  GLUCOSE 221* 228* 152*  BUN 22 22 36*  CREATININE 1.15* 1.23* 1.57*  CALCIUM  8.5* 8.8* 8.9  MG  3.8*  --  2.3  PHOS  --  2.6 4.6    Studies: No results found.  Scheduled Meds:  amLODipine   5 mg Oral Daily   aspirin  EC  81 mg Oral Daily   atorvastatin   80 mg Oral Daily   [START ON 10/31/2023] bisacodyl   10 mg Oral QHS   bisacodyl   10 mg Oral Once   bisoprolol   5 mg Oral Daily   Chlorhexidine  Gluconate Cloth  6 each Topical Daily   [START ON 10/31/2023] enoxaparin  (LOVENOX ) injection  30 mg Subcutaneous Q24H   free water   500 mL Oral Once   guaiFENesin   600 mg Oral BID   insulin  aspart  0-5 Units Subcutaneous QHS   insulin  aspart  0-9 Units Subcutaneous TID WC   ipratropium-albuterol   3 mL Nebulization BID   isosorbide  mononitrate  60 mg Oral Daily   losartan   50 mg Oral Daily   polyethylene glycol  17 g Oral BID   predniSONE   40 mg Oral Q breakfast   Continuous Infusions: PRN Meds: acetaminophen  **OR** acetaminophen , albuterol , bisacodyl , hydrALAZINE , melatonin,  ondansetron  **OR** ondansetron  (ZOFRAN ) IV, oxyCODONE   Time spent: 55 minutes  Author: ELVAN SOR. MD [facility] 10/30/2023 6:22 PM  To reach On-call, see care teams to locate the attending and reach out to them via [email or web address]. If 7PM-7AM, please contact night-coverage If you still have difficulty reaching the attending provider, please page the [facility] (Director on Call) for [facility] on amion for assistance.

## 2023-10-30 NOTE — Progress Notes (Signed)
 Heart Failure Navigator Progress Note  Assessed for Heart & Vascular TOC clinic readiness.  Patient does not meet criteria due to current [facility] patient.   Navigator will sign off at this time.  Charmaine Pines, RN, BSN [facility] Heart Failure Navigator Secure Chat Only

## 2023-10-30 NOTE — TOC CM/SW Note (Signed)
 Transition of Care [facility]) - Inpatient Brief Assessment   Patient Details  Name: Kimberly Fisher MRN: [id number] Date of Birth: October 08, 1955  [facility] of [facility] CM/SW Contact:    Lauraine JAYSON Carpen, LCSW Phone Number: 10/30/2023, 4:36 PM   Clinical Narrative: CSW reviewed chart. No PCP. Added list to AVS. No other TOC needs identified so far. CSW will continue to follow progress. Please place [facility] consult if any needs arise.  Transition of Care Asessment: Insurance and Status: Insurance coverage has been reviewed Patient has primary care physician: No Home environment has been reviewed: Apartment Prior level of function:: Not documented Prior/Current Home Services: No current home services Social Drivers of Health Review: SDOH reviewed no interventions necessary Readmission risk has been reviewed: Yes Transition of care needs: no transition of care needs at this time

## 2023-10-30 NOTE — Progress Notes (Signed)
 Per dr. Von, melatonin can be given now, instead of waiting until bedtime.

## 2023-10-30 NOTE — Progress Notes (Addendum)
 [facility] CLINIC CARDIOLOGY PROGRESS NOTE       Patient ID: Kimberly Fisher MRN: [id number] DOB/AGE: October 04, 1955 68 y.o.  Admit date: 10/28/2023 Referring Physician Dr. Cleatus Primary Physician Pcp, No Primary Cardiologist None Reason for Consultation acute CHF  HPI: Kimberly Fisher is a 68 y.o. female  with a past medical history of hypertension, hyperlipidemia, history of TIA in setting of hypertensive urgency (2022), COPD, tobacco use who presented to the ED on 10/28/2023 for worsening shortness of breath, abdominal bloating for the past week.  Patient also endorses chest pressure that radiates to her left arm and back that started yesterday that has been constant.  Patient denies any history of CAD, MI, HF or AF.  Patient states her mom died from a heart attack when she was 68 years old.  Cardiology was consulted for further evaluation.   Interval History: -Patient seen and examined this AM and laying comfortably in hospital bed, nearly flat. Patient states breathing is much improved. States chest pressure has overall improved, now intermittent/mild. -Pt appears near euvolemic.  -Patients BP elevated and HR stable this AM. Overnight Tele showed no significant events.  -Yesterday UOP 1.45L, with worsening renal function. -Patient remains on room air  with stable SpO2.  -Lactate normalized. -Cancelled LHC today due to worsening renal function, risk of contrast induced nephropathy.   Review of systems complete and found to be negative unless listed above    Past Medical History:  Diagnosis Date   Anxiety    Asthma    Cardiomegaly    COPD (chronic obstructive pulmonary disease) (HCC)    GERD (gastroesophageal reflux disease)    Hypertension    Low back pain    Mitral valve insufficiency    Nicotine  dependence    Obesity     Past Surgical History:  Procedure Laterality Date   CESAREAN SECTION     COLONOSCOPY WITH PROPOFOL  N/A 07/18/2015   Procedure: COLONOSCOPY WITH PROPOFOL ;   Surgeon: Donnice Vaughn Manes, MD;  Location: [facility] ENDOSCOPY;  Service: Endoscopy;  Laterality: N/A;   ESOPHAGOGASTRODUODENOSCOPY (EGD) WITH PROPOFOL  N/A 07/18/2015   Procedure: ESOPHAGOGASTRODUODENOSCOPY (EGD) WITH PROPOFOL ;  Surgeon: Donnice Vaughn Manes, MD;  Location: [facility] ENDOSCOPY;  Service: Endoscopy;  Laterality: N/A;   Essential Tubal Ligation     TONSILLECTOMY      Medications Prior to Admission  Medication Sig Dispense Refill Last Dose/Taking   albuterol  (VENTOLIN  HFA) 108 (90 Base) MCG/ACT inhaler Inhale 1-2 puffs into the lungs every 4 (four) hours as needed for wheezing or shortness of breath. 1 each 0 Taking As Needed   amLODipine  (NORVASC ) 5 MG tablet Take 1 tablet (5 mg total) by mouth daily. (Patient not taking: Reported on 10/29/2023) 30 tablet 2 Not Taking   aspirin  EC 81 MG EC tablet Take 1 tablet (81 mg total) by mouth daily. Swallow whole. (Patient not taking: Reported on 10/29/2023) 30 tablet 11 Not Taking   atorvastatin  (LIPITOR ) 40 MG tablet Take 1 tablet (40 mg total) by mouth daily. (Patient not taking: Reported on 10/29/2023) 30 tablet 0 Not Taking   budesonide -formoterol  (SYMBICORT ) 160-4.5 MCG/ACT inhaler Inhale 2 puffs into the lungs 4 (four) times daily. (Patient not taking: Reported on 10/29/2023) 1 each 0 Not Taking   hydrocortisone -pramoxine (PROCTOFOAM-HC) rectal foam Place 1 applicator rectally 2 (two) times daily. (Patient not taking: Reported on 10/29/2023) 10 g 1 Not Taking   lisinopril  (ZESTRIL ) 10 MG tablet Take 1 tablet (10 mg total) by mouth daily. (Patient not taking: Reported on 10/29/2023)  30 tablet 3 Not Taking   loperamide  (IMODIUM  A-D) 2 MG tablet Take 1 tablet (2 mg total) by mouth 4 (four) times daily as needed for diarrhea or loose stools. (Patient not taking: Reported on 10/29/2023) 30 tablet 0 Not Taking   nicotine  (NICODERM CQ  - DOSED IN MG/24 HOURS) 21 mg/24hr patch Place 1 patch (21 mg total) onto the skin daily. (Patient not taking: Reported on  10/29/2023) 28 patch 0 Not Taking   phenylephrine-shark liver oil-mineral oil-petrolatum (PREPARATION H) 0.25-14-74.9 % rectal ointment Place 1 Application rectally 2 (two) times daily as needed for hemorrhoids. (Patient not taking: Reported on 10/29/2023) 28 g 0 Not Taking   Social History   Socioeconomic History   Marital status: Widowed    Spouse name: Not on file   Number of children: Not on file   Years of education: Not on file   Highest education level: Not on file  Occupational History   Not on file  Tobacco Use   Smoking status: Every Day    Current packs/day: 1.00    Types: Cigarettes   Smokeless tobacco: Never  Substance and Sexual Activity   Alcohol use: No   Drug use: No   Sexual activity: Not on file  Other Topics Concern   Not on file  Social History Narrative   Not on file   Social Drivers of Health   Financial Resource Strain: Not on file  Food Insecurity: No Food Insecurity (10/29/2023)   Hunger Vital Sign    Worried About Running Out of Food in the Last Year: Never true    Ran Out of Food in the Last Year: Never true  Transportation Needs: No Transportation Needs (10/29/2023)   PRAPARE - Administrator, Civil Service (Medical): No    Lack of Transportation (Non-Medical): No  Physical Activity: Not on file  Stress: Not on file  Social Connections: Unknown (10/29/2023)   Social Connection and Isolation Panel    Frequency of Communication with Friends and Family: More than three times a week    Frequency of Social Gatherings with Friends and Family: More than three times a week    Attends Religious Services: More than 4 times per year    Active Member of [employer] or Organizations: Patient declined    Attends Banker Meetings: Patient declined    Marital Status: Patient declined  Intimate Partner Violence: Not At Risk (10/29/2023)   Humiliation, Afraid, Rape, and Kick questionnaire    Fear of Current or Ex-Partner: No    Emotionally  Abused: No    Physically Abused: No    Sexually Abused: No    History reviewed. No pertinent family history.   Vitals:   10/30/23 0428 10/30/23 0500 10/30/23 0733 10/30/23 0739  BP: (!) 151/83  (!) 167/83   Pulse: 66  64   Resp: 20  18   Temp: 98.6 F (37 C)  97.7 F (36.5 C)   TempSrc:      SpO2:   94% 94%  Weight:  51 kg    Height:        PHYSICAL EXAM General: Moderately ill-appearing elderly female, well nourished, in no acute distress. HEENT: Normocephalic and atraumatic. Neck: No JVD.   Lungs: Normal respiratory effort on room air.  CTAB Heart: HRR. Normal S1 and S2 without gallops or murmurs.  Abdomen: Non-distended appearing.  Msk: Normal strength and tone for age. Extremities: Warm and well perfused. No clubbing, cyanosis, edema.  Neuro: Alert  and oriented X 3. Psych: Answers questions appropriately.   Labs: Basic Metabolic Panel: Recent Labs    10/28/23 2309 10/29/23 1018 10/30/23 0529  NA 140 140 139  K 3.8 3.5 4.1  CL 109 103 102  CO2 21* 21* 25  GLUCOSE 221* 228* 152*  BUN 22 22 36*  CREATININE 1.15* 1.23* 1.57*  CALCIUM  8.5* 8.8* 8.9  MG 3.8*  --  2.3  PHOS  --  2.6 4.6   Liver Function Tests: Recent Labs    10/28/23 2309 10/30/23 0529  AST 45* 19  ALT 27 18  ALKPHOS 78 66  BILITOT 0.4 0.4  PROT 6.5 6.3*  ALBUMIN 3.2* 3.1*   No results for input(s): LIPASE, AMYLASE in the last 72 hours. CBC: Recent Labs    10/28/23 2309 10/29/23 1018 10/30/23 0529  WBC 7.7 8.2 15.8*  NEUTROABS 5.0  --   --   HGB 12.3 12.1 11.6*  HCT 38.6 38.5 35.9*  MCV 92.8 92.3 91.1  PLT 271 250 256   Cardiac Enzymes: Recent Labs    10/29/23 1015 10/29/23 1218 10/29/23 1435  TROPONINIHS 100* 123* 125*   BNP: Recent Labs    10/28/23 2309  BNP 900.3*   D-Dimer: No results for input(s): DDIMER in the last 72 hours. Hemoglobin A1C: No results for input(s): HGBA1C in the last 72 hours. Fasting Lipid Panel: Recent Labs    10/29/23 1018   CHOL 218*  HDL 72  LDLCALC 138*  TRIG 41  CHOLHDL 3.0   Thyroid  Function Tests: No results for input(s): TSH, T4TOTAL, T3FREE, THYROIDAB in the last 72 hours.  Invalid input(s): FREET3 Anemia Panel: No results for input(s): VITAMINB12, FOLATE, FERRITIN, TIBC, IRON, RETICCTPCT in the last 72 hours.   Radiology: ECHOCARDIOGRAM COMPLETE Result Date: 10/29/2023    ECHOCARDIOGRAM REPORT   Patient Name:   LORELEY SCHWALL Date of Exam: 10/29/2023 Medical Rec #:  [id number]      Height:       60.0 in Accession #:    [id number]     Weight:       113.1 lb Date of Birth:  Jan 02, 1956     BSA:          1.465 m Patient Age:    67 years       BP:           117/95 mmHg Patient Gender: F              HR:           105 bpm. Exam Location:  [facility] Procedure: 2D Echo, Cardiac Doppler and Color Doppler (Both Spectral and Color            Flow Doppler were utilized during procedure). Indications:     CHF  History:         Patient has no prior history of Echocardiogram examinations.                  COPD and TIA; Risk Factors:Hypertension.  Sonographer:     Thea Norlander Referring Phys:  [id number] DELAYNE LULLA SOLIAN Diagnosing Phys: Marsa Dooms MD IMPRESSIONS  1. Left ventricular ejection fraction, by estimation, is 60 to 65%. The left ventricle has normal function. The left ventricle has no regional wall motion abnormalities. There is severe concentric left ventricular hypertrophy. Left ventricular diastolic  parameters are consistent with Grade I diastolic dysfunction (impaired relaxation).  2. Right ventricular systolic function is normal. The right ventricular size is normal.  3. The mitral valve is normal in structure. Mild to moderate mitral valve regurgitation. No evidence of mitral stenosis.  4. The aortic valve is normal in structure. Aortic valve regurgitation is mild. No aortic stenosis is present.  5. The inferior vena cava is normal in size with greater than 50% respiratory variability,  suggesting right atrial pressure of 3 mmHg. FINDINGS  Left Ventricle: Left ventricular ejection fraction, by estimation, is 60 to 65%. The left ventricle has normal function. The left ventricle has no regional wall motion abnormalities. Strain was performed and the global longitudinal strain is indeterminate. The left ventricular internal cavity size was small. There is severe concentric left ventricular hypertrophy. Left ventricular diastolic parameters are consistent with Grade I diastolic dysfunction (impaired relaxation). Right Ventricle: The right ventricular size is normal. No increase in right ventricular wall thickness. Right ventricular systolic function is normal. Left Atrium: Left atrial size was normal in size. Right Atrium: Right atrial size was normal in size. Pericardium: There is no evidence of pericardial effusion. Mitral Valve: The mitral valve is normal in structure. Mild to moderate mitral valve regurgitation. No evidence of mitral valve stenosis. Tricuspid Valve: The tricuspid valve is normal in structure. Tricuspid valve regurgitation is not demonstrated. No evidence of tricuspid stenosis. Aortic Valve: The aortic valve is normal in structure. Aortic valve regurgitation is mild. No aortic stenosis is present. Aortic valve peak gradient measures 9.6 mmHg. Pulmonic Valve: The pulmonic valve was normal in structure. Pulmonic valve regurgitation is not visualized. No evidence of pulmonic stenosis. Aorta: The aortic root is normal in size and structure. Venous: The inferior vena cava is normal in size with greater than 50% respiratory variability, suggesting right atrial pressure of 3 mmHg. IAS/Shunts: No atrial level shunt detected by color flow Doppler. Additional Comments: 3D was performed not requiring image post processing on an independent workstation and was indeterminate.  LEFT VENTRICLE PLAX 2D LVIDd:         3.50 cm   Diastology LVIDs:         2.40 cm   LV e' medial:    4.03 cm/s LV PW:          1.80 cm   LV E/e' medial:  14.7 LV IVS:        1.60 cm   LV e' lateral:   5.22 cm/s LVOT diam:     2.00 cm   LV E/e' lateral: 11.3 LV SV:         48 LV SV Index:   33 LVOT Area:     3.14 cm  RIGHT VENTRICLE             IVC RV S prime:     23.10 cm/s  IVC diam: 0.80 cm TAPSE (M-mode): 1.8 cm LEFT ATRIUM             Index        RIGHT ATRIUM          Index LA diam:        4.50 cm 3.07 cm/m   RA Area:     9.50 cm LA Vol (A2C):   55.0 ml 37.54 ml/m  RA Volume:   17.50 ml 11.95 ml/m LA Vol (A4C):   57.8 ml 39.45 ml/m LA Biplane Vol: 61.3 ml 41.84 ml/m  AORTIC VALVE AV Area (Vmax): 2.35 cm AV Vmax:        155.00 cm/s AV Peak Grad:   9.6 mmHg LVOT Vmax:      116.00  cm/s LVOT Vmean:     70.700 cm/s LVOT VTI:       0.153 m  AORTA Ao Root diam: 3.50 cm Ao Asc diam:  3.40 cm MITRAL VALVE                TRICUSPID VALVE MV Area (PHT): 5.97 cm     TR Peak grad:   5.3 mmHg MV Decel Time: 127 msec     TR Vmax:        115.00 cm/s MR Peak grad: 141.8 mmHg MR Vmax:      595.33 cm/s   SHUNTS MV E velocity: 59.20 cm/s   Systemic VTI:  0.15 m MV A velocity: 103.00 cm/s  Systemic Diam: 2.00 cm MV E/A ratio:  0.57 Marsa Dooms MD Electronically signed by Marsa Dooms MD Signature Date/Time: 10/29/2023/12:22:52 PM    Final    DG Chest Portable 1 View Result Date: 10/29/2023 CLINICAL DATA:  COPD exacerbation EXAM: PORTABLE CHEST 1 VIEW COMPARISON:  S[age] teen 23 FINDINGS: Lung volumes are small. Superimposed mild diffuse interstitial pulmonary infiltrate is present, new since prior examination most in keeping with mild interstitial pulmonary edema. No pneumothorax. Small left pleural effusion. Cardiac size within normal limits. No acute bone IMPRESSION: 1. Mild interstitial pulmonary edema. 2. Small left pleural effusion. Electronically Signed   By: Dorethia Molt M.D.   On: 10/29/2023 00:57    ECHO as above  TELEMETRY reviewed by me 10/30/2023: Sinus rhythm, rate 60s  EKG reviewed by me: Sinus  tachycardia, rate 105 bpm with nonspecific ST-T wave changes.  Data reviewed by me 10/30/2023: last 24h vitals tele labs imaging I/O hospitalist progress notes.  Principal Problem:   Acute respiratory failure with hypoxia (HCC) Active Problems:   Hypertensive urgency   Acute exacerbation of CHF (congestive heart failure) (HCC)   Stage 3a chronic kidney disease (HCC)   History of TIA (transient ischemic attack)   COPD with acute exacerbation (HCC)   Hyperglycemia   Elevated troponin   Acute hypoxic respiratory failure (HCC)    ASSESSMENT AND PLAN:  Bostyn Kunkler is a 68 y.o. female  with a past medical history of hypertension, hyperlipidemia, history of TIA in setting of hypertensive urgency (2022), COPD, tobacco use who presented to the ED on 10/28/2023 for worsening shortness of breath, abdominal bloating for the past week.  Patient also endorses chest pressure that radiates to her left arm and back that started yesterday that has been constant.  Patient denies any history of CAD, MI, HF or AF.  Patient states her mom died from a heart attack when she was 68 years old.  Cardiology was consulted for further evaluation.   # Acute HFpEF # Acute respiratory failure with hypoxia # AKI Patient with no known hx of HF. Presents with SOB, abdominal bloating. BNP elevated at 900.  CXR with small left pleural effusion and pulmonary vascular congestion. SOB has improved s/p IV lasix  40 mg x2. Echo revealed pEF (60-65%), no RWMA, severe concentric LVH, grade I diastolic dysfunction, mild to mod MR. LA normalized. Patient appears near euvolemic.  - Monitor and replenish electrolytes for a goal K >4, Mag >2  - Discontinued IV Lasix  due to worsening renal function. Will consider PO lasix  20 mg tomorrow if renal function allows. - Will initiate dapagliflozin when renal function normalizes.(copay $12.15). Losartan , bisoprolol  as stated below. - Consider cMRI to further evaluate infiltrative disease.  Patient denies hx of biceps tendinitis or carpal tunnel syndrome.  #  Intermittent chest pressure # Hypertension # Hyperlipidemia # Demand ischemia, setting of AoCHFpEF, respiratory distress Patient with hx of noncompliance to medications, have not take BP meds for years. EKG with sinus tachycardia, rate 105 bpm with nonspecific ST-T wave changes.  Troponins mildly elevated and flat 50 > 79 > 100 > 123 > 125. Lipid panel with elevated LDL 138, chol 218. Chest pressure much improved. -Cancelled LHC today due to worsening renal function, risk of contrast induced nephropathy. This can be done outpatient when renal function normalizes. - Continue aspirin  81 mg - Continue atorvastatin  to 80 mg daily. - Continue home amlodipine  5 mg daily. - Continue losartan  50 mg daily. - Continue bisoprolol  5 mg daily.  - Ordered Imdur  60 mg daily.   # COPD exacerbation # Tobacco use On 2L O2 with stable SpO2, no O2 requirement at home.  - Management per primary team.  This patient's plan of care was discussed and created with Dr. Ammon and he is in agreement.  Signed: Dorene Comfort, PA-C  10/30/2023, 8:24 AM [facility]

## 2023-10-31 DIAGNOSIS — J9601 Acute respiratory failure with hypoxia: Secondary | ICD-10-CM | POA: Diagnosis not present

## 2023-10-31 LAB — GLUCOSE, CAPILLARY
Glucose-Capillary: 153 mg/dL — ABNORMAL HIGH (ref 70–99)
Glucose-Capillary: 82 mg/dL (ref 70–99)
Glucose-Capillary: 129 mg/dL — ABNORMAL HIGH (ref 70–99)
Glucose-Capillary: 156 mg/dL — ABNORMAL HIGH (ref 70–99)

## 2023-10-31 LAB — PHOSPHORUS: Phosphorus: 5.2 mg/dL — ABNORMAL HIGH (ref 2.5–4.6)

## 2023-10-31 LAB — CBC
HCT: 39.7 % (ref 36.0–46.0)
Hemoglobin: 12.5 g/dL (ref 12.0–15.0)
MCH: 29.1 pg (ref 26.0–34.0)
MCHC: 31.5 g/dL (ref 30.0–36.0)
MCV: 92.5 fL (ref 80.0–100.0)
Platelets: 309 K/uL (ref 150–400)
RBC: 4.29 MIL/uL (ref 3.87–5.11)
RDW: 13.8 % (ref 11.5–15.5)
WBC: 15.8 K/uL — ABNORMAL HIGH (ref 4.0–10.5)
nRBC: 0 % (ref 0.0–0.2)

## 2023-10-31 LAB — HEPATIC FUNCTION PANEL
ALT: 16 U/L (ref 0–44)
AST: 15 U/L (ref 15–41)
Albumin: 3.2 g/dL — ABNORMAL LOW (ref 3.5–5.0)
Alkaline Phosphatase: 67 U/L (ref 38–126)
Bilirubin, Direct: <0.1 mg/dL (ref 0.0–0.2)
Total Bilirubin: <0.2 mg/dL (ref 0.0–1.2)
Total Protein: 6.4 g/dL — ABNORMAL LOW (ref 6.5–8.1)

## 2023-10-31 LAB — BASIC METABOLIC PANEL WITH GFR
Anion gap: 9 (ref 5–15)
BUN: 55 mg/dL — ABNORMAL HIGH (ref 8–23)
CO2: 27 mmol/L (ref 22–32)
Calcium: 8.8 mg/dL — ABNORMAL LOW (ref 8.9–10.3)
Chloride: 104 mmol/L (ref 98–111)
Creatinine, Ser: 1.82 mg/dL — ABNORMAL HIGH (ref 0.44–1.00)
GFR, Estimated: 30 mL/min — ABNORMAL LOW (ref >60)
Glucose, Bld: 98 mg/dL (ref 70–99)
Potassium: 4.2 mmol/L (ref 3.5–5.1)
Sodium: 140 mmol/L (ref 135–145)

## 2023-10-31 LAB — MAGNESIUM: Magnesium: 2.4 mg/dL (ref 1.7–2.4)

## 2023-10-31 MED ORDER — ACETAMINOPHEN 500 MG PO TABS
1000.0000 mg | ORAL_TABLET | Freq: Four times a day (QID) | ORAL | Status: DC | PRN
  Administered 2023-10-31 – 2023-11-02 (×5): 1000 mg via ORAL
  Filled 2023-10-31 (×5): qty 2

## 2023-10-31 MED ORDER — ALPRAZOLAM 0.5 MG PO TABS
0.5000 mg | ORAL_TABLET | Freq: Three times a day (TID) | ORAL | Status: DC | PRN
  Administered 2023-10-31 – 2023-11-02 (×5): 0.5 mg via ORAL
  Filled 2023-10-31 (×5): qty 1

## 2023-10-31 MED ORDER — ACETAMINOPHEN 650 MG RE SUPP: 650.0000 mg | Freq: Four times a day (QID) | RECTAL | Status: DC | PRN

## 2023-10-31 MED ORDER — BISACODYL 5 MG PO TBEC
10.0000 mg | DELAYED_RELEASE_TABLET | Freq: Once | ORAL | Status: AC
  Administered 2023-10-31: 10 mg via ORAL
  Filled 2023-10-31: qty 2

## 2023-10-31 MED ORDER — HYDROCORTISONE ACETATE 25 MG RE SUPP
25.0000 mg | Freq: Two times a day (BID) | RECTAL | Status: AC
  Administered 2023-10-31 – 2023-11-01 (×4): 25 mg via RECTAL
  Filled 2023-10-31 (×6): qty 1

## 2023-10-31 MED ORDER — LIDOCAINE 5 % EX PTCH
1.0000 | MEDICATED_PATCH | CUTANEOUS | Status: DC
  Administered 2023-10-31 – 2023-11-02 (×3): 1 via TRANSDERMAL
  Filled 2023-10-31 (×4): qty 1

## 2023-10-31 MED ORDER — ISOSORBIDE MONONITRATE ER 60 MG PO TB24
120.0000 mg | ORAL_TABLET | Freq: Every day | ORAL | Status: DC
  Administered 2023-11-01: 120 mg via ORAL
  Filled 2023-10-31: qty 2

## 2023-10-31 NOTE — Plan of Care (Signed)
  Problem: Education: Goal: Ability to describe self-care measures that may prevent or decrease complications (Diabetes Survival Skills Education) will improve Outcome: Progressing   Problem: Education: Goal: Individualized Educational Video(s) Outcome: Progressing   Problem: Coping: Goal: Ability to adjust to condition or change in health will improve Outcome: Progressing

## 2023-10-31 NOTE — Care Management Important Message (Signed)
 Important Message  Patient Details  Name: Kimberly Fisher MRN: [id number] Date of Birth: 10/11/1955   Important Message Given:  Yes - Medicare IM     Rojelio SHAUNNA Rattler 10/31/2023, 12:16 PM

## 2023-10-31 NOTE — Progress Notes (Signed)
 [facility] CLINIC CARDIOLOGY PROGRESS NOTE       Patient ID: Kimberly Fisher MRN: [id number] DOB/AGE: Feb 20, 1956 68 y.o.  Admit date: 10/28/2023 Referring Physician Dr. Cleatus Primary Physician Pcp, No Primary Cardiologist None Reason for Consultation acute CHF  HPI: Kimberly Fisher is a 68 y.o. female  with a past medical history of hypertension, hyperlipidemia, history of TIA in setting of hypertensive urgency (2022), COPD, tobacco use who presented to the ED on 10/28/2023 for worsening shortness of breath, abdominal bloating for the past week.  Patient also endorses chest pressure that radiates to her left arm and back that started yesterday that has been constant.  Patient denies any history of CAD, MI, HF or AF.  Patient states her mom died from a heart attack when she was 68 years old.  Cardiology was consulted for further evaluation.   Interval History: -Patient seen and examined this AM and laying comfortably in hospital bed. Patient states she feels okay this AM. Reports mild chest pressure but overall improved. Denies SOB. Reports she hasn't had BM since admission and states she has abdominal pain and bloating.  -Patient ambulated with walker this AM and did well. -Pt appears near euvolemic.  -Patients BP elevated and HR stable this AM. Per tele has short run of atrial tachycardia on 08/14, patient aSX at this time. Will continue to monitor.  -Patient remains on room air  with stable SpO2.  -Renal function continues to worsen.    Review of systems complete and found to be negative unless listed above    Past Medical History:  Diagnosis Date   Anxiety    Asthma    Cardiomegaly    COPD (chronic obstructive pulmonary disease) (HCC)    GERD (gastroesophageal reflux disease)    Hypertension    Low back pain    Mitral valve insufficiency    Nicotine  dependence    Obesity     Past Surgical History:  Procedure Laterality Date   CESAREAN SECTION     COLONOSCOPY WITH PROPOFOL   N/A 07/18/2015   Procedure: COLONOSCOPY WITH PROPOFOL ;  Surgeon: Donnice Vaughn Manes, MD;  Location[facility] ENDOSCOPY;  Service: Endoscopy;  Laterality: N/A;   ESOPHAGOGASTRODUODENOSCOPY (EGD) WITH PROPOFOL  N/A 07/18/2015   Procedure: ESOPHAGOGASTRODUODENOSCOPY (EGD) WITH PROPOFOL ;  Surgeon: Donnice Vaughn Manes, MD;  Location: [facility] ENDOSCOPY;  Service: Endoscopy;  Laterality: N/A;   Essential Tubal Ligation     TONSILLECTOMY      Medications Prior to Admission  Medication Sig Dispense Refill Last Dose/Taking   albuterol  (VENTOLIN  HFA) 108 (90 Base) MCG/ACT inhaler Inhale 1-2 puffs into the lungs every 4 (four) hours as needed for wheezing or shortness of breath. 1 each 0 Taking As Needed   amLODipine  (NORVASC ) 5 MG tablet Take 1 tablet (5 mg total) by mouth daily. (Patient not taking: Reported on 10/29/2023) 30 tablet 2 Not Taking   aspirin  EC 81 MG EC tablet Take 1 tablet (81 mg total) by mouth daily. Swallow whole. (Patient not taking: Reported on 10/29/2023) 30 tablet 11 Not Taking   atorvastatin  (LIPITOR ) 40 MG tablet Take 1 tablet (40 mg total) by mouth daily. (Patient not taking: Reported on 10/29/2023) 30 tablet 0 Not Taking   budesonide -formoterol  (SYMBICORT ) 160-4.5 MCG/ACT inhaler Inhale 2 puffs into the lungs 4 (four) times daily. (Patient not taking: Reported on 10/29/2023) 1 each 0 Not Taking   hydrocortisone -pramoxine (PROCTOFOAM-HC) rectal foam Place 1 applicator rectally 2 (two) times daily. (Patient not taking: Reported on 10/29/2023) 10 g 1 Not Taking  lisinopril  (ZESTRIL ) 10 MG tablet Take 1 tablet (10 mg total) by mouth daily. (Patient not taking: Reported on 10/29/2023) 30 tablet 3 Not Taking   loperamide  (IMODIUM  A-D) 2 MG tablet Take 1 tablet (2 mg total) by mouth 4 (four) times daily as needed for diarrhea or loose stools. (Patient not taking: Reported on 10/29/2023) 30 tablet 0 Not Taking   nicotine  (NICODERM CQ  - DOSED IN MG/24 HOURS) 21 mg/24hr patch Place 1 patch (21 mg total) onto  the skin daily. (Patient not taking: Reported on 10/29/2023) 28 patch 0 Not Taking   phenylephrine -shark liver oil-mineral oil-petrolatum (PREPARATION H) 0.25-14-74.9 % rectal ointment Place 1 Application rectally 2 (two) times daily as needed for hemorrhoids. (Patient not taking: Reported on 10/29/2023) 28 g 0 Not Taking   Social History   Socioeconomic History   Marital status: Widowed    Spouse name: Not on file   Number of children: Not on file   Years of education: Not on file   Highest education level: Not on file  Occupational History   Not on file  Tobacco Use   Smoking status: Every Day    Current packs/day: 1.00    Types: Cigarettes   Smokeless tobacco: Never  Substance and Sexual Activity   Alcohol use: No   Drug use: No   Sexual activity: Not on file  Other Topics Concern   Not on file  Social History Narrative   Not on file   Social Drivers of Health   Financial Resource Strain: Not on file  Food Insecurity: No Food Insecurity (10/29/2023)   Hunger Vital Sign    Worried About Running Out of Food in the Last Year: Never true    Ran Out of Food in the Last Year: Never true  Transportation Needs: No Transportation Needs (10/29/2023)   PRAPARE - Administrator, Civil Service (Medical): No    Lack of Transportation (Non-Medical): No  Physical Activity: Not on file  Stress: Not on file  Social Connections: Unknown (10/29/2023)   Social Connection and Isolation Panel    Frequency of Communication with Friends and Family: More than three times a week    Frequency of Social Gatherings with Friends and Family: More than three times a week    Attends Religious Services: More than 4 times per year    Active Member of [employer] or Organizations: Patient declined    Attends Banker Meetings: Patient declined    Marital Status: Patient declined  Intimate Partner Violence: Not At Risk (10/29/2023)   Humiliation, Afraid, Rape, and Kick questionnaire    Fear  of Current or Ex-Partner: No    Emotionally Abused: No    Physically Abused: No    Sexually Abused: No    History reviewed. No pertinent family history.   Vitals:   10/30/23 2250 10/31/23 0500 10/31/23 0519 10/31/23 0837  BP: (!) 152/85  (!) 156/99 (!) 156/88  Pulse: 78  (!) 56 61  Resp: 20  18   Temp: 98.2 F (36.8 C)  98.1 F (36.7 C) 98.1 F (36.7 C)  TempSrc: Oral  Oral Oral  SpO2: 92%  97% 100%  Weight:  51 kg    Height:        PHYSICAL EXAM General: Moderately ill-appearing elderly female, well nourished, in no acute distress. HEENT: Normocephalic and atraumatic. Neck: No JVD.   Lungs: Normal respiratory effort on room air.  CTAB Heart: HRR. Normal S1 and S2 without gallops or  murmurs.  Abdomen: Non-distended appearing.  Msk: Normal strength and tone for age. Extremities: Warm and well perfused. No clubbing, cyanosis, edema.  Neuro: Alert and oriented X 3. Psych: Answers questions appropriately.   Labs: Basic Metabolic Panel: Recent Labs    10/30/23 0529 10/31/23 0659  NA 139 140  K 4.1 4.2  CL 102 104  CO2 25 27  GLUCOSE 152* 98  BUN 36* 55*  CREATININE 1.57* 1.82*  CALCIUM  8.9 8.8*  MG 2.3 2.4  PHOS 4.6 5.2*   Liver Function Tests: Recent Labs    10/30/23 0529 10/31/23 0659  AST 19 15  ALT 18 16  ALKPHOS 66 67  BILITOT 0.4 <0.2  PROT 6.3* 6.4*  ALBUMIN 3.1* 3.2*   No results for input(s): LIPASE, AMYLASE in the last 72 hours. CBC: Recent Labs    10/28/23 2309 10/29/23 1018 10/30/23 0529 10/31/23 0659  WBC 7.7   < > 15.8* 15.8*  NEUTROABS 5.0  --   --   --   HGB 12.3   < > 11.6* 12.5  HCT 38.6   < > 35.9* 39.7  MCV 92.8   < > 91.1 92.5  PLT 271   < > 256 309   < > = values in this interval not displayed.   Cardiac Enzymes: Recent Labs    10/29/23 1015 10/29/23 1218 10/29/23 1435  TROPONINIHS 100* 123* 125*   BNP: Recent Labs    10/28/23 2309  BNP 900.3*   D-Dimer: No results for input(s): DDIMER in the last 72  hours. Hemoglobin A1C: Recent Labs    10/29/23 1737  HGBA1C 5.4   Fasting Lipid Panel: Recent Labs    10/29/23 1018  CHOL 218*  HDL 72  LDLCALC 138*  TRIG 41  CHOLHDL 3.0   Thyroid  Function Tests: No results for input(s): TSH, T4TOTAL, T3FREE, THYROIDAB in the last 72 hours.  Invalid input(s): FREET3 Anemia Panel: No results for input(s): VITAMINB12, FOLATE, FERRITIN, TIBC, IRON, RETICCTPCT in the last 72 hours.   Radiology: ECHOCARDIOGRAM COMPLETE Result Date: 10/29/2023    ECHOCARDIOGRAM REPORT   Patient Name:   ROSALYND MCWRIGHT Date of Exam: 10/29/2023 Medical Rec #:  [id number]      Height:       60.0 in Accession #:    [id number]     Weight:       113.1 lb Date of Birth:  1955-09-16     BSA:          1.465 m Patient Age:    67 years       BP:           117/95 mmHg Patient Gender: F              HR:           105 bpm. Exam Location:  [facility] Procedure: 2D Echo, Cardiac Doppler and Color Doppler (Both Spectral and Color            Flow Doppler were utilized during procedure). Indications:     CHF  History:         Patient has no prior history of Echocardiogram examinations.                  COPD and TIA; Risk Factors:Hypertension.  Sonographer:     Thea Norlander Referring Phys:  [id number] DELAYNE LULLA SOLIAN Diagnosing Phys: Marsa Dooms MD IMPRESSIONS  1. Left ventricular ejection fraction, by estimation, is 60 to 65%. The left ventricle  has normal function. The left ventricle has no regional wall motion abnormalities. There is severe concentric left ventricular hypertrophy. Left ventricular diastolic  parameters are consistent with Grade I diastolic dysfunction (impaired relaxation).  2. Right ventricular systolic function is normal. The right ventricular size is normal.  3. The mitral valve is normal in structure. Mild to moderate mitral valve regurgitation. No evidence of mitral stenosis.  4. The aortic valve is normal in structure. Aortic valve regurgitation is  mild. No aortic stenosis is present.  5. The inferior vena cava is normal in size with greater than 50% respiratory variability, suggesting right atrial pressure of 3 mmHg. FINDINGS  Left Ventricle: Left ventricular ejection fraction, by estimation, is 60 to 65%. The left ventricle has normal function. The left ventricle has no regional wall motion abnormalities. Strain was performed and the global longitudinal strain is indeterminate. The left ventricular internal cavity size was small. There is severe concentric left ventricular hypertrophy. Left ventricular diastolic parameters are consistent with Grade I diastolic dysfunction (impaired relaxation). Right Ventricle: The right ventricular size is normal. No increase in right ventricular wall thickness. Right ventricular systolic function is normal. Left Atrium: Left atrial size was normal in size. Right Atrium: Right atrial size was normal in size. Pericardium: There is no evidence of pericardial effusion. Mitral Valve: The mitral valve is normal in structure. Mild to moderate mitral valve regurgitation. No evidence of mitral valve stenosis. Tricuspid Valve: The tricuspid valve is normal in structure. Tricuspid valve regurgitation is not demonstrated. No evidence of tricuspid stenosis. Aortic Valve: The aortic valve is normal in structure. Aortic valve regurgitation is mild. No aortic stenosis is present. Aortic valve peak gradient measures 9.6 mmHg. Pulmonic Valve: The pulmonic valve was normal in structure. Pulmonic valve regurgitation is not visualized. No evidence of pulmonic stenosis. Aorta: The aortic root is normal in size and structure. Venous: The inferior vena cava is normal in size with greater than 50% respiratory variability, suggesting right atrial pressure of 3 mmHg. IAS/Shunts: No atrial level shunt detected by color flow Doppler. Additional Comments: 3D was performed not requiring image post processing on an independent workstation and was  indeterminate.  LEFT VENTRICLE PLAX 2D LVIDd:         3.50 cm   Diastology LVIDs:         2.40 cm   LV e' medial:    4.03 cm/s LV PW:         1.80 cm   LV E/e' medial:  14.7 LV IVS:        1.60 cm   LV e' lateral:   5.22 cm/s LVOT diam:     2.00 cm   LV E/e' lateral: 11.3 LV SV:         48 LV SV Index:   33 LVOT Area:     3.14 cm  RIGHT VENTRICLE             IVC RV S prime:     23.10 cm/s  IVC diam: 0.80 cm TAPSE (M-mode): 1.8 cm LEFT ATRIUM             Index        RIGHT ATRIUM          Index LA diam:        4.50 cm 3.07 cm/m   RA Area:     9.50 cm LA Vol (A2C):   55.0 ml 37.54 ml/m  RA Volume:   17.50 ml 11.95 ml/m LA Vol (  A4C):   57.8 ml 39.45 ml/m LA Biplane Vol: 61.3 ml 41.84 ml/m  AORTIC VALVE AV Area (Vmax): 2.35 cm AV Vmax:        155.00 cm/s AV Peak Grad:   9.6 mmHg LVOT Vmax:      116.00 cm/s LVOT Vmean:     70.700 cm/s LVOT VTI:       0.153 m  AORTA Ao Root diam: 3.50 cm Ao Asc diam:  3.40 cm MITRAL VALVE                TRICUSPID VALVE MV Area (PHT): 5.97 cm     TR Peak grad:   5.3 mmHg MV Decel Time: 127 msec     TR Vmax:        115.00 cm/s MR Peak grad: 141.8 mmHg MR Vmax:      595.33 cm/s   SHUNTS MV E velocity: 59.20 cm/s   Systemic VTI:  0.15 m MV A velocity: 103.00 cm/s  Systemic Diam: 2.00 cm MV E/A ratio:  0.57 Marsa Dooms MD Electronically signed by Marsa Dooms MD Signature Date/Time: 10/29/2023/12:22:52 PM    Final    DG Chest Portable 1 View Result Date: 10/29/2023 CLINICAL DATA:  COPD exacerbation EXAM: PORTABLE CHEST 1 VIEW COMPARISON:  [age] teen 23 FINDINGS: Lung volumes are small. Superimposed mild diffuse interstitial pulmonary infiltrate is present, new since prior examination most in keeping with mild interstitial pulmonary edema. No pneumothorax. Small left pleural effusion. Cardiac size within normal limits. No acute bone IMPRESSION: 1. Mild interstitial pulmonary edema. 2. Small left pleural effusion. Electronically Signed   By: Dorethia Molt M.D.   On:  10/29/2023 00:57    ECHO as above  TELEMETRY reviewed by me 10/31/2023: Sinus rhythm, rate 70s  EKG reviewed by me: Sinus tachycardia, rate 105 bpm with nonspecific ST-T wave changes.  Data reviewed by me 10/31/2023: last 24h vitals tele labs imaging I/O hospitalist progress notes.  Principal Problem:   Acute respiratory failure with hypoxia (HCC) Active Problems:   Hypertensive urgency   Acute exacerbation of CHF (congestive heart failure) (HCC)   Stage 3a chronic kidney disease (HCC)   History of TIA (transient ischemic attack)   COPD with acute exacerbation (HCC)   Hyperglycemia   Elevated troponin   Acute hypoxic respiratory failure (HCC)    ASSESSMENT AND PLAN:  Archie Atilano is a 68 y.o. female  with a past medical history of hypertension, hyperlipidemia, history of TIA in setting of hypertensive urgency (2022), COPD, tobacco use who presented to the ED on 10/28/2023 for worsening shortness of breath, abdominal bloating for the past week.  Patient also endorses chest pressure that radiates to her left arm and back that started yesterday that has been constant.  Patient denies any history of CAD, MI, HF or AF.  Patient states her mom died from a heart attack when she was 68 years old.  Cardiology was consulted for further evaluation.   # Acute HFpEF # Acute respiratory failure with hypoxia # AKI Patient with no known hx of HF. Presents with SOB, abdominal bloating. BNP elevated at 900.  CXR with small left pleural effusion and pulmonary vascular congestion. SOB has improved s/p IV lasix  40 mg x2. Echo revealed pEF (60-65%), no RWMA, severe concentric LVH, grade I diastolic dysfunction, mild to mod MR. LA normalized. Patient appears near euvolemic.  - Monitor and replenish electrolytes for a goal K >4, Mag >2  - Hold PO lasix  for now until renal  function improves. - Will initiate dapagliflozin when renal function normalizes.(copay $12.15). Losartan , bisoprolol  as stated  below. - Consider cMRI to further evaluate infiltrative disease. Patient denies hx of biceps tendinitis or carpal tunnel syndrome. -Recommend nephrology consult due to worsening renal function.  Appreciate recommendations.  # Intermittent chest pressure # Hypertension # Hyperlipidemia # Demand ischemia, setting of AoCHFpEF, respiratory distress Patient with hx of noncompliance to medications, have not take BP meds for years. EKG with sinus tachycardia, rate 105 bpm with nonspecific ST-T wave changes.  Troponins mildly elevated and flat 50 > 79 > 100 > 123 > 125. Lipid panel with elevated LDL 138, chol 218. Chest pressure much improved. -Cancelled LHC due to worsening renal function, risk of contrast induced nephropathy. This can be done outpatient when renal function normalizes. - Continue aspirin  81 mg - Continue atorvastatin  to 80 mg daily. - Continue home amlodipine  5 mg daily. - Continue losartan  50 mg daily. - Continue bisoprolol  5 mg daily.  - Increased Imdur  to 120 mg daily.   # COPD exacerbation # Tobacco use On 2L O2 with stable SpO2, no O2 requirement at home.  - Management per primary team.  This patient's plan of care was discussed and created with Dr. Ammon and he is in agreement.  Signed: Dorene Comfort, PA-C  10/31/2023, 9:49 AM [facility]

## 2023-10-31 NOTE — Progress Notes (Signed)
 [facility] Progress Note  Patient: Kimberly Fisher    FMW:[id number]  DOA: 10/28/2023     Date of Service: the patient was seen and examined on 10/31/2023  Chief Complaint  Patient presents with   Shortness of Breath   Brief hospital course: Macil Crady is a 68 y.o. female with medical history significant for HTN, COPD, TIA 2022 in the setting of hypertensive urgency, being admitted with new onset CHF as well as possible COPD.  She arrived by EMS on CPAP after being called out for respiratory distress.  She denied chest pain, fever or chills.  She endorses orthopnea.  En route she received DuoNebs, and magnesium. In the ED, hypertensive to 231/119, tachycardic to 105, tachypneic to the mid 20s, transitioned to O2 at 6 L on arrival, maintaining sats in the mid 90s. Labs notable for troponin 50 and BNP 900 CBC WNL CMP notable for hyperglycemia of 221, Baseline creatinine of 1.15 EKG showing sinus tachycardia at 105 ST depression with T wave inversion inferolateral leads Chest x-ray with mild interstitial pulmonary edema and small left pleural effusion Patient treated with DuoNebs and Solu-Medrol  and placed on Nitropaste and given a dose of Lasix  Admission requested    Assessment and Plan:  Acute respiratory failure with hypoxia (HCC) Patient arrived on CPAP by EMS.  O2 sat reportedly 32% on room air with EMS Likely secondary to a combination of CHF and COPD S/p BiPAP, which has been weaned off Currently saturating well on room air.  Respiratory failure resolved   COPD with acute exacerbation (HCC) Scheduled and as needed nebulized bronchodilators S/p IV steroids, followed by prednisone  40 mg p.o. for 4 days Antitussives, flutter valve Supplemental O2 as above   Acute exacerbation of dCHF (congestive heart failure) Hypertensive urgency/emergency No prior history of CHF S/p Nitropaste for BP control and CHF management IV Lasix  Continue home Amlodipine , DC'd lisinopril  and  started losartan  50 mg p.o. daily.  Started Imdur  60 mg p.o. daily TTE shows LVEF 65%, severe concentric LV hypertrophy and grade 1 diastolic dysfunction S/p IV Lasix  given for diuresis 8/13 stopped Lasix  for now due to elevated creatinine Patient is euvolemic currently. Monitor renal function and urine output Cardiology consulted, recommended cardiac cath after renal function improvement, can be done as an outpatient. Continue to monitor on telemetry   AKI most likely due to diuresis Stage 3a chronic kidney disease Renal function at baseline 8/14 creatinine 1.82 elevated Monitor renal functions daily Nephrology consulted  # Elevated troponin Troponin 50--> 79, without chest pain or ischemic EKG findings Suspect demand ischemia TTE as above  Cardiology consulted, recommend cardiac cath after infection improvement. Started Imdur  60 mg pod    Hyperglycemia Random blood glucose 221. No prior history of diabetes We will get A1c to [facility] for diabetes Sliding scale coverage     History of TIA (transient ischemic attack) No acute issues Continue aspirin  and statin   # Urinary retention, Foley catheter was inserted on 8/12 and 450 mL urine was collected.  Continue Foley catheter until patient is ambulatory and then do the voiding trial  Plan is to remove Foley catheter once renal functions improve   # Constipation, started laxatives  # Hemorrhoids: Started hemorrhoidal suppositories Patient was advised to change position every 12 hourly and ambulate.  Avoid straining.  Use laxatives.   Body mass index is 21.96 kg/m.  Interventions:  Diet: Heart healthy/carb modified diet, fluid restriction 1.5 L/day  DVT Prophylaxis: Subcutaneous Lovenox    Advance goals of  care discussion: Full code  Family Communication: family was present at bedside, at the time of interview.  The pt provided permission to discuss medical plan with the family. Opportunity was given to ask question  and all questions were answered satisfactorily.   Disposition:  Pt is from Home, admitted with CHF, developed renal failure.  Creatinine, which precludes a safe discharge. Discharge to home, when stable and cleared by cardiology, may need few days to improve.  Subjective: No significant events overnight, patient has mild chest pain but she is more concerned about pain in her bottom due to hemorrhoids.  No any other complaints. Patient was advised about management of hemorrhoids.  Also informed that her creatinine is elevated so we can continue to monitor and nephrology has been consulted.    Physical Exam: General: NAD, lying comfortably Appear in no distress, affect appropriate Eyes: PERRLA ENT: Oral Mucosa Clear, moist  Neck: no JVD,  Cardiovascular: S1 and S2 Present, no Murmur,  Respiratory: good respiratory effort, Bilateral Air entry equal and Decreased, no Crackles, no wheezes Abdomen: Bowel Sound present, Soft and no tenderness,  Skin: no rashes Extremities: no Pedal edema, no calf tenderness Neurologic: without any new focal findings Gait not checked due to patient safety concerns  Vitals:   10/31/23 0519 10/31/23 0837 10/31/23 1159 10/31/23 1550  BP: (!) 156/99 (!) 156/88 122/65 128/70  Pulse: (!) 56 61 93   Resp: 18     Temp: 98.1 F (36.7 C) 98.1 F (36.7 C) 97.8 F (36.6 C) 97.9 F (36.6 C)  TempSrc: Oral Oral  Oral  SpO2: 97% 100% 93% 93%  Weight:      Height:        Intake/Output Summary (Last 24 hours) at 10/31/2023 1823 Last data filed at 10/31/2023 1300 Gross per 24 hour  Intake 1700 ml  Output 525 ml  Net 1175 ml   Filed Weights   10/29/23 0345 10/30/23 0500 10/31/23 0500  Weight: 51.3 kg 51 kg 51 kg    Data Reviewed: I have personally reviewed and interpreted daily labs, tele strips, imagings as discussed above. I reviewed all nursing notes, pharmacy notes, vitals, pertinent old records I have discussed plan of care as described above with RN  and patient/family.  CBC: Recent Labs  Lab 10/28/23 2309 10/29/23 1018 10/30/23 0529 10/31/23 0659  WBC 7.7 8.2 15.8* 15.8*  NEUTROABS 5.0  --   --   --   HGB 12.3 12.1 11.6* 12.5  HCT 38.6 38.5 35.9* 39.7  MCV 92.8 92.3 91.1 92.5  PLT 271 250 256 309   Basic Metabolic Panel: Recent Labs  Lab 10/28/23 2309 10/29/23 1018 10/30/23 0529 10/31/23 0659  NA 140 140 139 140  K 3.8 3.5 4.1 4.2  CL 109 103 102 104  CO2 21* 21* 25 27  GLUCOSE 221* 228* 152* 98  BUN 22 22 36* 55*  CREATININE 1.15* 1.23* 1.57* 1.82*  CALCIUM  8.5* 8.8* 8.9 8.8*  MG 3.8*  --  2.3 2.4  PHOS  --  2.6 4.6 5.2*    Studies: No results found.  Scheduled Meds:  amLODipine   5 mg Oral Daily   aspirin  EC  81 mg Oral Daily   atorvastatin   80 mg Oral Daily   bisoprolol   5 mg Oral Daily   Chlorhexidine  Gluconate Cloth  6 each Topical Daily   docusate sodium   100 mg Oral Daily   enoxaparin  (LOVENOX ) injection  30 mg Subcutaneous Q24H   free water   500 mL Oral Once   guaiFENesin   600 mg Oral BID   hydrocortisone   25 mg Rectal BID   insulin  aspart  0-5 Units Subcutaneous QHS   insulin  aspart  0-9 Units Subcutaneous TID WC   [START ON 11/01/2023] isosorbide  mononitrate  120 mg Oral Daily   lidocaine   1 patch Transdermal Q24H   losartan   50 mg Oral Daily   melatonin  5 mg Oral QHS   polyethylene glycol  17 g Oral BID   predniSONE   40 mg Oral Q breakfast   Continuous Infusions: PRN Meds: acetaminophen  **OR** acetaminophen , ALPRAZolam , bisacodyl , hydrALAZINE , ipratropium-albuterol , ondansetron  **OR** ondansetron  (ZOFRAN ) IV, oxyCODONE   Time spent: 55 minutes  Author: ELVAN SOR. MD [facility] 10/31/2023 6:23 PM  To reach On-call, see care teams to locate the attending and reach out to them via [email or web address]. If 7PM-7AM, please contact night-coverage If you still have difficulty reaching the attending provider, please page the [facility] (Director on Call) for [facility] on amion for  assistance.

## 2023-11-01 ENCOUNTER — Inpatient Hospital Stay

## 2023-11-01 DIAGNOSIS — J9601 Acute respiratory failure with hypoxia: Secondary | ICD-10-CM | POA: Diagnosis not present

## 2023-11-01 LAB — CBC
HCT: 38.1 % (ref 36.0–46.0)
Hemoglobin: 12.4 g/dL (ref 12.0–15.0)
MCH: 29.9 pg (ref 26.0–34.0)
MCHC: 32.5 g/dL (ref 30.0–36.0)
MCV: 91.8 fL (ref 80.0–100.0)
Platelets: 290 K/uL (ref 150–400)
RBC: 4.15 MIL/uL (ref 3.87–5.11)
RDW: 13.7 % (ref 11.5–15.5)
WBC: 11.5 K/uL — ABNORMAL HIGH (ref 4.0–10.5)
nRBC: 0 % (ref 0.0–0.2)

## 2023-11-01 LAB — BASIC METABOLIC PANEL WITH GFR
Anion gap: 7 (ref 5–15)
BUN: 61 mg/dL — ABNORMAL HIGH (ref 8–23)
CO2: 25 mmol/L (ref 22–32)
Calcium: 8.8 mg/dL — ABNORMAL LOW (ref 8.9–10.3)
Chloride: 109 mmol/L (ref 98–111)
Creatinine, Ser: 1.59 mg/dL — ABNORMAL HIGH (ref 0.44–1.00)
GFR, Estimated: 36 mL/min — ABNORMAL LOW (ref >60)
Glucose, Bld: 121 mg/dL — ABNORMAL HIGH (ref 70–99)
Potassium: 4.6 mmol/L (ref 3.5–5.1)
Sodium: 141 mmol/L (ref 135–145)

## 2023-11-01 LAB — HEPATIC FUNCTION PANEL
ALT: 15 U/L (ref 0–44)
AST: 17 U/L (ref 15–41)
Albumin: 3 g/dL — ABNORMAL LOW (ref 3.5–5.0)
Alkaline Phosphatase: 70 U/L (ref 38–126)
Bilirubin, Direct: <0.1 mg/dL (ref 0.0–0.2)
Total Bilirubin: 0.7 mg/dL (ref 0.0–1.2)
Total Protein: 6.5 g/dL (ref 6.5–8.1)

## 2023-11-01 LAB — GLUCOSE, CAPILLARY
Glucose-Capillary: 114 mg/dL — ABNORMAL HIGH (ref 70–99)
Glucose-Capillary: 146 mg/dL — ABNORMAL HIGH (ref 70–99)
Glucose-Capillary: 168 mg/dL — ABNORMAL HIGH (ref 70–99)
Glucose-Capillary: 196 mg/dL — ABNORMAL HIGH (ref 70–99)

## 2023-11-01 LAB — MAGNESIUM: Magnesium: 2.5 mg/dL — ABNORMAL HIGH (ref 1.7–2.4)

## 2023-11-01 LAB — PHOSPHORUS: Phosphorus: 4.2 mg/dL (ref 2.5–4.6)

## 2023-11-01 MED ORDER — AMLODIPINE BESYLATE 10 MG PO TABS
10.0000 mg | ORAL_TABLET | Freq: Every day | ORAL | Status: DC
  Administered 2023-11-01 – 2023-11-02 (×2): 10 mg via ORAL
  Filled 2023-11-01 (×2): qty 1

## 2023-11-01 NOTE — Progress Notes (Signed)
 [facility] CLINIC CARDIOLOGY PROGRESS NOTE       Patient ID: Kimberly Fisher MRN: [id number] DOB/AGE: [birth year] 68 y.o.  Admit date: 10/28/2023 Referring Physician Dr. Cleatus Primary Physician Pcp, No Primary Cardiologist None Reason for Consultation acute CHF  HPI: Kimberly Fisher is a 68 y.o. female  with a past medical history of hypertension, hyperlipidemia, history of TIA in setting of hypertensive urgency (2022), COPD, tobacco use who presented to the ED on 10/28/2023 for worsening shortness of breath, abdominal bloating for the past week.  Patient also endorses chest pressure that radiates to her left arm and back that started yesterday that has been constant.  Patient denies any history of CAD, MI, HF or AF.  Patient states her mom died from a heart attack when she was 68 years old.  Cardiology was consulted for further evaluation.   Interval History: -Patient seen and examined this AM and laying comfortably in hospital bed. Patient states she feels okay this AM. Reports mild chest pressure but overall improved. Denies SOB. -BM this morning.  -Pt appears euvolemic. Cr improved on AM labs.  -Patients BP elevated and HR stable this AM. -Patient remains on room air  with stable SpO2.    Review of systems complete and found to be negative unless listed above    Past Medical History:  Diagnosis Date   Anxiety    Asthma    Cardiomegaly    COPD (chronic obstructive pulmonary disease) (HCC)    GERD (gastroesophageal reflux disease)    Hypertension    Low back pain    Mitral valve insufficiency    Nicotine  dependence    Obesity     Past Surgical History:  Procedure Laterality Date   CESAREAN SECTION     COLONOSCOPY WITH PROPOFOL  N/A 07/18/2015   Procedure: COLONOSCOPY WITH PROPOFOL ;  Surgeon: Donnice Vaughn Manes, MD;  Location: [facility] ENDOSCOPY;  Service: Endoscopy;  Laterality: N/A;   ESOPHAGOGASTRODUODENOSCOPY (EGD) WITH PROPOFOL  N/A 07/18/2015   Procedure:  ESOPHAGOGASTRODUODENOSCOPY (EGD) WITH PROPOFOL ;  Surgeon: Donnice Vaughn Manes, MD;  Location: [facility] ENDOSCOPY;  Service: Endoscopy;  Laterality: N/A;   Essential Tubal Ligation     TONSILLECTOMY      Medications Prior to Admission  Medication Sig Dispense Refill Last Dose/Taking   albuterol  (VENTOLIN  HFA) 108 (90 Base) MCG/ACT inhaler Inhale 1-2 puffs into the lungs every 4 (four) hours as needed for wheezing or shortness of breath. 1 each 0 Taking As Needed   amLODipine  (NORVASC ) 5 MG tablet Take 1 tablet (5 mg total) by mouth daily. (Patient not taking: Reported on 10/29/2023) 30 tablet 2 Not Taking   aspirin  EC 81 MG EC tablet Take 1 tablet (81 mg total) by mouth daily. Swallow whole. (Patient not taking: Reported on 10/29/2023) 30 tablet 11 Not Taking   atorvastatin  (LIPITOR ) 40 MG tablet Take 1 tablet (40 mg total) by mouth daily. (Patient not taking: Reported on 10/29/2023) 30 tablet 0 Not Taking   budesonide -formoterol  (SYMBICORT ) 160-4.5 MCG/ACT inhaler Inhale 2 puffs into the lungs 4 (four) times daily. (Patient not taking: Reported on 10/29/2023) 1 each 0 Not Taking   hydrocortisone -pramoxine (PROCTOFOAM-HC) rectal foam Place 1 applicator rectally 2 (two) times daily. (Patient not taking: Reported on 10/29/2023) 10 g 1 Not Taking   lisinopril  (ZESTRIL ) 10 MG tablet Take 1 tablet (10 mg total) by mouth daily. (Patient not taking: Reported on 10/29/2023) 30 tablet 3 Not Taking   loperamide  (IMODIUM  A-D) 2 MG tablet Take 1 tablet (2 mg total) by mouth  4 (four) times daily as needed for diarrhea or loose stools. (Patient not taking: Reported on 10/29/2023) 30 tablet 0 Not Taking   nicotine  (NICODERM CQ  - DOSED IN MG/24 HOURS) 21 mg/24hr patch Place 1 patch (21 mg total) onto the skin daily. (Patient not taking: Reported on 10/29/2023) 28 patch 0 Not Taking   phenylephrine -shark liver oil-mineral oil-petrolatum (PREPARATION H) 0.25-14-74.9 % rectal ointment Place 1 Application rectally 2 (two) times daily  as needed for hemorrhoids. (Patient not taking: Reported on 10/29/2023) 28 g 0 Not Taking   Social History   Socioeconomic History   Marital status: Widowed    Spouse name: Not on file   Number of children: Not on file   Years of education: Not on file   Highest education level: Not on file  Occupational History   Not on file  Tobacco Use   Smoking status: Every Day    Current packs/day: 1.00    Types: Cigarettes   Smokeless tobacco: Never  Substance and Sexual Activity   Alcohol use: No   Drug use: No   Sexual activity: Not on file  Other Topics Concern   Not on file  Social History Narrative   Not on file   Social Drivers of Health   Financial Resource Strain: Not on file  Food Insecurity: No Food Insecurity (10/29/2023)   Hunger Vital Sign    Worried About Running Out of Food in the Last Year: Never true    Ran Out of Food in the Last Year: Never true  Transportation Needs: No Transportation Needs (10/29/2023)   PRAPARE - Administrator, Civil Service (Medical): No    Lack of Transportation (Non-Medical): No  Physical Activity: Not on file  Stress: Not on file  Social Connections: Unknown (10/29/2023)   Social Connection and Isolation Panel    Frequency of Communication with Friends and Family: More than three times a week    Frequency of Social Gatherings with Friends and Family: More than three times a week    Attends Religious Services: More than 4 times per year    Active Member of [employer] or Organizations: Patient declined    Attends Banker Meetings: Patient declined    Marital Status: Patient declined  Intimate Partner Violence: Not At Risk (10/29/2023)   Humiliation, Afraid, Rape, and Kick questionnaire    Fear of Current or Ex-Partner: No    Emotionally Abused: No    Physically Abused: No    Sexually Abused: No    History reviewed. No pertinent family history.   Vitals:   11/01/23 0451 11/01/23 0550 11/01/23 0827 11/01/23 0828   BP:  (!) 167/89 (!) 159/97   Pulse:  (!) 52 65 71  Resp:  20    Temp:  98.2 F (36.8 C) 98.2 F (36.8 C)   TempSrc:  Oral Oral   SpO2:  95% 90% 100%  Weight: 51 kg     Height:        PHYSICAL EXAM General: Chronically ill-appearing elderly female, well nourished, in no acute distress. HEENT: Normocephalic and atraumatic. Neck: No JVD.   Lungs: Normal respiratory effort on room air.  CTAB Heart: HRR. Normal S1 and S2 without gallops or murmurs.  Abdomen: Non-distended appearing.  Msk: Normal strength and tone for age. Extremities: Warm and well perfused. No clubbing, cyanosis, edema.  Neuro: Alert and oriented X 3. Psych: Answers questions appropriately.   Labs: Basic Metabolic Panel: Recent Labs    10/31/23  9340 11/01/23 0609  NA 140 141  K 4.2 4.6  CL 104 109  CO2 27 25  GLUCOSE 98 121*  BUN 55* 61*  CREATININE 1.82* 1.59*  CALCIUM  8.8* 8.8*  MG 2.4 2.5*  PHOS 5.2* 4.2   Liver Function Tests: Recent Labs    10/31/23 0659 11/01/23 0609  AST 15 17  ALT 16 15  ALKPHOS 67 70  BILITOT <0.2 0.7  PROT 6.4* 6.5  ALBUMIN 3.2* 3.0*   No results for input(s): LIPASE, AMYLASE in the last 72 hours. CBC: Recent Labs    10/31/23 0659 11/01/23 0609  WBC 15.8* 11.5*  HGB 12.5 12.4  HCT 39.7 38.1  MCV 92.5 91.8  PLT 309 290   Cardiac Enzymes: Recent Labs    10/29/23 1015 10/29/23 1218 10/29/23 1435  TROPONINIHS 100* 123* 125*   BNP: No results for input(s): BNP in the last 72 hours.  D-Dimer: No results for input(s): DDIMER in the last 72 hours. Hemoglobin A1C: Recent Labs    10/29/23 1737  HGBA1C 5.4   Fasting Lipid Panel: Recent Labs    10/29/23 1018  CHOL 218*  HDL 72  LDLCALC 138*  TRIG 41  CHOLHDL 3.0   Thyroid  Function Tests: No results for input(s): TSH, T4TOTAL, T3FREE, THYROIDAB in the last 72 hours.  Invalid input(s): FREET3 Anemia Panel: No results for input(s): VITAMINB12, FOLATE, FERRITIN, TIBC,  IRON, RETICCTPCT in the last 72 hours.   Radiology: ECHOCARDIOGRAM COMPLETE Result Date: 10/29/2023    ECHOCARDIOGRAM REPORT   Patient Name:   Kimberly Fisher Date of Exam: 10/29/2023 Medical Rec #:  [id number]      Height:       60.0 in Accession #:    [id number]     Weight:       113.1 lb Date of Birth:  Jul 30, 1955     BSA:          1.465 m Patient Age:    67 years       BP:           117/95 mmHg Patient Gender: F              HR:           105 bpm. Exam Location:  [facility] Procedure: 2D Echo, Cardiac Doppler and Color Doppler (Both Spectral and Color            Flow Doppler were utilized during procedure). Indications:     CHF  History:         Patient has no prior history of Echocardiogram examinations.                  COPD and TIA; Risk Factors:Hypertension.  Sonographer:     Thea Norlander Referring Phys:  [id number] DELAYNE LULLA SOLIAN Diagnosing Phys: Marsa Dooms MD IMPRESSIONS  1. Left ventricular ejection fraction, by estimation, is 60 to 65%. The left ventricle has normal function. The left ventricle has no regional wall motion abnormalities. There is severe concentric left ventricular hypertrophy. Left ventricular diastolic  parameters are consistent with Grade I diastolic dysfunction (impaired relaxation).  2. Right ventricular systolic function is normal. The right ventricular size is normal.  3. The mitral valve is normal in structure. Mild to moderate mitral valve regurgitation. No evidence of mitral stenosis.  4. The aortic valve is normal in structure. Aortic valve regurgitation is mild. No aortic stenosis is present.  5. The inferior vena cava is normal in size with greater  than 50% respiratory variability, suggesting right atrial pressure of 3 mmHg. FINDINGS  Left Ventricle: Left ventricular ejection fraction, by estimation, is 60 to 65%. The left ventricle has normal function. The left ventricle has no regional wall motion abnormalities. Strain was performed and the global longitudinal  strain is indeterminate. The left ventricular internal cavity size was small. There is severe concentric left ventricular hypertrophy. Left ventricular diastolic parameters are consistent with Grade I diastolic dysfunction (impaired relaxation). Right Ventricle: The right ventricular size is normal. No increase in right ventricular wall thickness. Right ventricular systolic function is normal. Left Atrium: Left atrial size was normal in size. Right Atrium: Right atrial size was normal in size. Pericardium: There is no evidence of pericardial effusion. Mitral Valve: The mitral valve is normal in structure. Mild to moderate mitral valve regurgitation. No evidence of mitral valve stenosis. Tricuspid Valve: The tricuspid valve is normal in structure. Tricuspid valve regurgitation is not demonstrated. No evidence of tricuspid stenosis. Aortic Valve: The aortic valve is normal in structure. Aortic valve regurgitation is mild. No aortic stenosis is present. Aortic valve peak gradient measures 9.6 mmHg. Pulmonic Valve: The pulmonic valve was normal in structure. Pulmonic valve regurgitation is not visualized. No evidence of pulmonic stenosis. Aorta: The aortic root is normal in size and structure. Venous: The inferior vena cava is normal in size with greater than 50% respiratory variability, suggesting right atrial pressure of 3 mmHg. IAS/Shunts: No atrial level shunt detected by color flow Doppler. Additional Comments: 3D was performed not requiring image post processing on an independent workstation and was indeterminate.  LEFT VENTRICLE PLAX 2D LVIDd:         3.50 cm   Diastology LVIDs:         2.40 cm   LV e' medial:    4.03 cm/s LV PW:         1.80 cm   LV E/e' medial:  14.7 LV IVS:        1.60 cm   LV e' lateral:   5.22 cm/s LVOT diam:     2.00 cm   LV E/e' lateral: 11.3 LV SV:         48 LV SV Index:   33 LVOT Area:     3.14 cm  RIGHT VENTRICLE             IVC RV S prime:     23.10 cm/s  IVC diam: 0.80 cm TAPSE  (M-mode): 1.8 cm LEFT ATRIUM             Index        RIGHT ATRIUM          Index LA diam:        4.50 cm 3.07 cm/m   RA Area:     9.50 cm LA Vol (A2C):   55.0 ml 37.54 ml/m  RA Volume:   17.50 ml 11.95 ml/m LA Vol (A4C):   57.8 ml 39.45 ml/m LA Biplane Vol: 61.3 ml 41.84 ml/m  AORTIC VALVE AV Area (Vmax): 2.35 cm AV Vmax:        155.00 cm/s AV Peak Grad:   9.6 mmHg LVOT Vmax:      116.00 cm/s LVOT Vmean:     70.700 cm/s LVOT VTI:       0.153 m  AORTA Ao Root diam: 3.50 cm Ao Asc diam:  3.40 cm MITRAL VALVE                TRICUSPID  VALVE MV Area (PHT): 5.97 cm     TR Peak grad:   5.3 mmHg MV Decel Time: 127 msec     TR Vmax:        115.00 cm/s MR Peak grad: 141.8 mmHg MR Vmax:      595.33 cm/s   SHUNTS MV E velocity: 59.20 cm/s   Systemic VTI:  0.15 m MV A velocity: 103.00 cm/s  Systemic Diam: 2.00 cm MV E/A ratio:  0.57 Marsa Dooms MD Electronically signed by Marsa Dooms MD Signature Date/Time: 10/29/2023/12:22:52 PM    Final    DG Chest Portable 1 View Result Date: 10/29/2023 CLINICAL DATA:  COPD exacerbation EXAM: PORTABLE CHEST 1 VIEW COMPARISON:  S[age] teen 23 FINDINGS: Lung volumes are small. Superimposed mild diffuse interstitial pulmonary infiltrate is present, new since prior examination most in keeping with mild interstitial pulmonary edema. No pneumothorax. Small left pleural effusion. Cardiac size within normal limits. No acute bone IMPRESSION: 1. Mild interstitial pulmonary edema. 2. Small left pleural effusion. Electronically Signed   By: Dorethia Molt M.D.   On: 10/29/2023 00:57    ECHO as above  TELEMETRY reviewed by me 11/01/2023: Sinus rhythm, rate 50s  EKG reviewed by me: Sinus tachycardia, rate 105 bpm with nonspecific ST-T wave changes.  Data reviewed by me 11/01/2023: last 24h vitals tele labs imaging I/O hospitalist progress notes.  Principal Problem:   Acute respiratory failure with hypoxia (HCC) Active Problems:   Hypertensive urgency   Acute  exacerbation of CHF (congestive heart failure) (HCC)   Stage 3a chronic kidney disease (HCC)   History of TIA (transient ischemic attack)   COPD with acute exacerbation (HCC)   Hyperglycemia   Elevated troponin   Acute hypoxic respiratory failure (HCC)    ASSESSMENT AND PLAN:  Kimberly Fisher is a 68 y.o. female  with a past medical history of hypertension, hyperlipidemia, history of TIA in setting of hypertensive urgency (2022), COPD, tobacco use who presented to the ED on 10/28/2023 for worsening shortness of breath, abdominal bloating for the past week.  Patient also endorses chest pressure that radiates to her left arm and back that started yesterday that has been constant.  Patient denies any history of CAD, MI, HF or AF.  Patient states her mom died from a heart attack when she was 68 years old.  Cardiology was consulted for further evaluation.   # Acute HFpEF # Acute respiratory failure with hypoxia # AKI Patient with no known hx of HF. Presents with SOB, abdominal bloating. BNP elevated at 900.  CXR with small left pleural effusion and pulmonary vascular congestion. SOB has improved s/p IV lasix  40 mg x2. Echo revealed pEF (60-65%), no RWMA, severe concentric LVH, grade I diastolic dysfunction, mild to mod MR. LA normalized. Patient appears near euvolemic.  - Monitor and replenish electrolytes for a goal K >4, Mag >2  - Hold PO lasix  for now until renal function improves. - Will initiate dapagliflozin when renal function normalizes.(copay $12.15). Losartan , bisoprolol  as stated below. - Consider cMRI in future to further evaluate infiltrative disease. Patient denies hx of biceps tendinitis or carpal tunnel syndrome. -Nephrology consulted by primary team.  # Intermittent chest pressure # Hypertension # Hyperlipidemia # Demand ischemia, setting of AoCHFpEF, respiratory distress Patient with hx of noncompliance to medications, have not take BP meds for years. EKG with sinus  tachycardia, rate 105 bpm with nonspecific ST-T wave changes.  Troponins mildly elevated and flat 50 > 79 > 100 >  123 > 125. Lipid panel with elevated LDL 138, chol 218. Chest pressure much improved. -Cancelled LHC due to worsening renal function, risk of contrast induced nephropathy. This can be done outpatient when renal function normalizes. - Continue aspirin  81 mg and atorvastatin  to 80 mg daily. - Increase amlodipine  to 10 mg daily. Continue losartan  50 mg daily and bisoprolol  5 mg daily. - Continue bisoprolol  5 mg daily.  - Continue Imdur  120 mg daily. Consider maximization of dose later today if BP remains elevated.   # COPD exacerbation # Tobacco use On 2L O2 with stable SpO2, no O2 requirement at home.  - Management per primary team.  This patient's plan of care was discussed and created with Dr. Ammon and he is in agreement.  Signed: Danita Bloch, PA-C  11/01/2023, 9:28 AM [facility] Cardiology

## 2023-11-01 NOTE — Progress Notes (Addendum)
 [facility] Progress Note  Patient: Kimberly Fisher    FMW:[id number]  DOA: 10/28/2023     Date of Service: the patient was seen and examined on 11/01/2023  Chief Complaint  Patient presents with   Shortness of Breath   Brief hospital course: Atha Muradyan is a 68 y.o. female with medical history significant for HTN, COPD, TIA 2022 in the setting of hypertensive urgency, being admitted with new onset CHF as well as possible COPD.  She arrived by EMS on CPAP after being called out for respiratory distress.  She denied chest pain, fever or chills.  She endorses orthopnea.  En route she received DuoNebs, and magnesium. In the ED, hypertensive to 231/119, tachycardic to 105, tachypneic to the mid 20s, transitioned to O2 at 6 L on arrival, maintaining sats in the mid 90s. Labs notable for troponin 50 and BNP 900 CBC WNL CMP notable for hyperglycemia of 221, Baseline creatinine of 1.15 EKG showing sinus tachycardia at 105 ST depression with T wave inversion inferolateral leads Chest x-ray with mild interstitial pulmonary edema and small left pleural effusion Patient treated with DuoNebs and Solu-Medrol  and placed on Nitropaste and given a dose of Lasix  Admission requested    Assessment and Plan:  Acute respiratory failure with hypoxia (HCC) Patient arrived on CPAP by EMS.  O2 sat reportedly 32% on room air with EMS Likely secondary to a combination of CHF and COPD S/p BiPAP, which has been weaned off Currently saturating well on room air.  Respiratory failure resolved   COPD with acute exacerbation (HCC) Scheduled and as needed nebulized bronchodilators S/p IV steroids, s/p prednisone  40 mg p.o. for 4 days Antitussives, flutter valve Supplemental O2 as above   Acute exacerbation of dCHF (congestive heart failure) Hypertensive urgency/emergency No prior history of CHF S/p Nitropaste for BP control and CHF management S/p IV Lasix  8/15, increased Amlodipine  10 mg DC'd lisinopril   and started losartan  50 mg p.o. daily.  Started Imdur  60 mg p.o. daily TTE shows LVEF 65%, severe concentric LV hypertrophy and grade 1 diastolic dysfunction S/p IV Lasix  given for diuresis 8/13 stopped Lasix  for now due to elevated creatinine Patient is euvolemic currently. Monitor renal function and urine output Cardiology consulted, recommended cardiac cath after renal function improvement, can be done as an outpatient. Continue to monitor on telemetry    AKI most likely due to diuresis Stage 3a chronic kidney disease Baseline creatinine 1.15 with GFR 53 on 8/11  8/14 creatinine 1.82 elevated 8/15 sCr 1.59 slightly improved,  continue to hold Lasix  for now Monitor renal functions daily Nephrology consulted  # Elevated troponin Troponin 50--> 79, without chest pain or ischemic EKG findings Suspect demand ischemia TTE as above  Cardiology consulted, recommend cardiac cath after infection improvement. Started Imdur  60 mg pod    # Hyperglycemia Random blood glucose 221. No prior history of diabetes We will get A1c to [facility] for diabetes Sliding scale coverage     # History of TIA (transient ischemic attack) No acute issues Continue aspirin  and statin   # Urinary retention, Foley catheter was inserted on 8/12 and 450 mL urine was collected.  Continue Foley catheter until patient is ambulatory and then do the voiding trial  Plan is to remove Foley catheter once renal functions improve   # Constipation, started laxatives  # Hemorrhoids: Started hemorrhoidal suppositories Patient was advised to change position every 12 hourly and ambulate.  Avoid straining.  Use laxatives.   Body mass index is 21.96 kg/m.  Interventions:  Diet: Heart healthy/carb modified diet, fluid restriction 1.5 L/day  DVT Prophylaxis: Subcutaneous Lovenox    Advance goals of care discussion: Full code  Family Communication: family was present at bedside, at the time of interview.  The pt  provided permission to discuss medical plan with the family. Opportunity was given to ask question and all questions were answered satisfactorily.   Disposition:  Pt is from Home, admitted with CHF, developed renal failure.  Creatinine, which precludes a safe discharge. Discharge to home, when stable and cleared by cardiology, may need few days to improve.  Subjective: No significant events overnight, patient was laying comfortably, denied any complaints.  Patient was sleepy so plan of care discussed with patient's family at bedside. We will continue to monitor today and possible discharge tomorrow a.m.   Physical Exam: General: NAD, lying comfortably Appear in no distress, affect appropriate Eyes: PERRLA ENT: Oral Mucosa Clear, moist  Neck: no JVD,  Cardiovascular: S1 and S2 Present, no Murmur,  Respiratory: good respiratory effort, Bilateral Air entry equal and Decreased, no Crackles, no wheezes Abdomen: Bowel Sound present, Soft and no tenderness,  Skin: no rashes Extremities: no Pedal edema, no calf tenderness Neurologic: without any new focal findings Gait not checked due to patient safety concerns  Vitals:   11/01/23 0550 11/01/23 0827 11/01/23 0828 11/01/23 1508  BP: (!) 167/89 (!) 159/97  (!) 106/56  Pulse: (!) 52 65 71   Resp: 20     Temp: 98.2 F (36.8 C) 98.2 F (36.8 C)  97.7 F (36.5 C)  TempSrc: Oral Oral  Oral  SpO2: 95% 90% 100% 94%  Weight:      Height:        Intake/Output Summary (Last 24 hours) at 11/01/2023 1535 Last data filed at 11/01/2023 1435 Gross per 24 hour  Intake 720 ml  Output --  Net 720 ml   Filed Weights   10/30/23 0500 10/31/23 0500 11/01/23 0451  Weight: 51 kg 51 kg 51 kg    Data Reviewed: I have personally reviewed and interpreted daily labs, tele strips, imagings as discussed above. I reviewed all nursing notes, pharmacy notes, vitals, pertinent old records I have discussed plan of care as described above with RN and  patient/family.  CBC: Recent Labs  Lab 10/28/23 2309 10/29/23 1018 10/30/23 0529 10/31/23 0659 11/01/23 0609  WBC 7.7 8.2 15.8* 15.8* 11.5*  NEUTROABS 5.0  --   --   --   --   HGB 12.3 12.1 11.6* 12.5 12.4  HCT 38.6 38.5 35.9* 39.7 38.1  MCV 92.8 92.3 91.1 92.5 91.8  PLT 271 250 256 309 290   Basic Metabolic Panel: Recent Labs  Lab 10/28/23 2309 10/29/23 1018 10/30/23 0529 10/31/23 0659 11/01/23 0609  NA 140 140 139 140 141  K 3.8 3.5 4.1 4.2 4.6  CL 109 103 102 104 109  CO2 21* 21* 25 27 25   GLUCOSE 221* 228* 152* 98 121*  BUN 22 22 36* 55* 61*  CREATININE 1.15* 1.23* 1.57* 1.82* 1.59*  CALCIUM  8.5* 8.8* 8.9 8.8* 8.8*  MG 3.8*  --  2.3 2.4 2.5*  PHOS  --  2.6 4.6 5.2* 4.2    Studies: No results found.  Scheduled Meds:  amLODipine   10 mg Oral Daily   aspirin  EC  81 mg Oral Daily   atorvastatin   80 mg Oral Daily   bisoprolol   5 mg Oral Daily   Chlorhexidine  Gluconate Cloth  6 each Topical Daily  docusate sodium   100 mg Oral Daily   enoxaparin  (LOVENOX ) injection  30 mg Subcutaneous Q24H   free water   500 mL Oral Once   guaiFENesin   600 mg Oral BID   hydrocortisone   25 mg Rectal BID   insulin  aspart  0-5 Units Subcutaneous QHS   insulin  aspart  0-9 Units Subcutaneous TID WC   isosorbide  mononitrate  120 mg Oral Daily   lidocaine   1 patch Transdermal Q24H   losartan   50 mg Oral Daily   melatonin  5 mg Oral QHS   polyethylene glycol  17 g Oral BID   predniSONE   40 mg Oral Q breakfast   Continuous Infusions: PRN Meds: acetaminophen  **OR** acetaminophen , ALPRAZolam , bisacodyl , hydrALAZINE , ipratropium-albuterol , ondansetron  **OR** ondansetron  (ZOFRAN ) IV, oxyCODONE   Time spent: 40 minutes  Author: ELVAN SOR. MD [facility] 11/01/2023 3:35 PM  To reach On-call, see care teams to locate the attending and reach out to them via [email or web address]. If 7PM-7AM, please contact night-coverage If you still have difficulty reaching the attending provider,  please page the [facility] (Director on Call) for [facility] on amion for assistance.

## 2023-11-01 NOTE — Plan of Care (Signed)
  Problem: Education: Goal: Ability to describe self-care measures that may prevent or decrease complications (Diabetes Survival Skills Education) will improve Outcome: Progressing   Problem: Education: Goal: Individualized Educational Video(s) Outcome: Progressing   Problem: Fluid Volume: Goal: Ability to maintain a balanced intake and output will improve Outcome: Progressing   Problem: Health Behavior/Discharge Planning: Goal: Ability to identify and utilize available resources and services will improve Outcome: Progressing

## 2023-11-01 NOTE — Plan of Care (Signed)
 Problem: Education: Goal: Ability to describe self-care measures that may prevent or decrease complications (Diabetes Survival Skills Education) will improve Outcome: Progressing Goal: Individualized Educational Video(s) Outcome: Progressing   Problem: Coping: Goal: Ability to adjust to condition or change in health will improve Outcome: Progressing   Problem: Fluid Volume: Goal: Ability to maintain a balanced intake and output will improve Outcome: Progressing   Problem: Health Behavior/Discharge Planning: Goal: Ability to identify and utilize available resources and services will improve Outcome: Progressing Goal: Ability to manage health-related needs will improve Outcome: Progressing   Problem: Metabolic: Goal: Ability to maintain appropriate glucose levels will improve Outcome: Progressing   Problem: Nutritional: Goal: Maintenance of adequate nutrition will improve Outcome: Progressing Goal: Progress toward achieving an optimal weight will improve Outcome: Progressing   Problem: Skin Integrity: Goal: Risk for impaired skin integrity will decrease Outcome: Progressing   Problem: Tissue Perfusion: Goal: Adequacy of tissue perfusion will improve Outcome: Progressing   Problem: Education: Goal: Knowledge of General Education information will improve Description: Including pain rating scale, medication(s)/side effects and non-pharmacologic comfort measures Outcome: Progressing   Problem: Health Behavior/Discharge Planning: Goal: Ability to manage health-related needs will improve Outcome: Progressing   Problem: Clinical Measurements: Goal: Ability to maintain clinical measurements within normal limits will improve Outcome: Progressing Goal: Will remain free from infection Outcome: Progressing Goal: Diagnostic test results will improve Outcome: Progressing Goal: Respiratory complications will improve Outcome: Progressing Goal: Cardiovascular complication will  be avoided Outcome: Progressing   Problem: Activity: Goal: Risk for activity intolerance will decrease Outcome: Progressing   Problem: Nutrition: Goal: Adequate nutrition will be maintained Outcome: Progressing   Problem: Coping: Goal: Level of anxiety will decrease Outcome: Progressing   Problem: Elimination: Goal: Will not experience complications related to bowel motility Outcome: Progressing Goal: Will not experience complications related to urinary retention Outcome: Progressing   Problem: Pain Managment: Goal: General experience of comfort will improve and/or be controlled Outcome: Progressing   Problem: Safety: Goal: Ability to remain free from injury will improve Outcome: Progressing   Problem: Skin Integrity: Goal: Risk for impaired skin integrity will decrease Outcome: Progressing   Problem: Education: Goal: Ability to demonstrate management of disease process will improve Outcome: Progressing Goal: Ability to verbalize understanding of medication therapies will improve Outcome: Progressing Goal: Individualized Educational Video(s) Outcome: Progressing   Problem: Activity: Goal: Capacity to carry out activities will improve Outcome: Progressing   Problem: Cardiac: Goal: Ability to achieve and maintain adequate cardiopulmonary perfusion will improve Outcome: Progressing   Problem: Education: Goal: Knowledge of disease or condition will improve Outcome: Progressing Goal: Knowledge of the prescribed therapeutic regimen will improve Outcome: Progressing Goal: Individualized Educational Video(s) Outcome: Progressing   Problem: Activity: Goal: Ability to tolerate increased activity will improve Outcome: Progressing Goal: Will verbalize the importance of balancing activity with adequate rest periods Outcome: Progressing   Problem: Respiratory: Goal: Ability to maintain a clear airway will improve Outcome: Progressing Goal: Levels of oxygenation  will improve Outcome: Progressing Goal: Ability to maintain adequate ventilation will improve Outcome: Progressing   Problem: Education: Goal: Understanding of CV disease, CV risk reduction, and recovery process will improve Outcome: Progressing Goal: Individualized Educational Video(s) Outcome: Progressing   Problem: Activity: Goal: Ability to return to baseline activity level will improve Outcome: Progressing   Problem: Cardiovascular: Goal: Ability to achieve and maintain adequate cardiovascular perfusion will improve Outcome: Progressing Goal: Vascular access site(s) Level 0-1 will be maintained Outcome: Progressing   Problem: Health Behavior/Discharge Planning: Goal: Ability to safely  manage health-related needs after discharge will improve Outcome: Progressing

## 2023-11-01 NOTE — Consult Note (Signed)
 [facility]  CONSULT NOTE    Date: 11/01/2023                  Patient Name:  Kimberly Fisher  MRN: [id number]  DOB: 01-Jul-1955  Age / Sex: 68 y.o., female         PCP: Pcp, No                 Service Requesting Consult: TRH                 Reason for Consult: Acute kidney injury            History of Present Illness: Kimberly Fisher is a 68 y.o.  female with past medical history including HTN, HLD, TIA, COPD, who was admitted to [facility] on 10/28/2023 for SOB (shortness of breath) [R06.02] Hypoxia [R09.02] COPD exacerbation (HCC) [J44.1] Acute exacerbation of CHF (congestive heart failure) (HCC) [I50.9] Acute hypoxic respiratory failure (HCC) [J96.01]  Patient presents to the ED with shortness of breath. Poor historian. Family at bedside states she has been short of breath for a couple days.   Labs on ED arrival include BNP 900, troponin 50. Creatinine has peaked at 1.82 after diuresis.    Medications: Outpatient medications: Medications Prior to Admission  Medication Sig Dispense Refill Last Dose/Taking   albuterol  (VENTOLIN  HFA) 108 (90 Base) MCG/ACT inhaler Inhale 1-2 puffs into the lungs every 4 (four) hours as needed for wheezing or shortness of breath. 1 each 0 Taking As Needed   amLODipine  (NORVASC ) 5 MG tablet Take 1 tablet (5 mg total) by mouth daily. (Patient not taking: Reported on 10/29/2023) 30 tablet 2 Not Taking   aspirin  EC 81 MG EC tablet Take 1 tablet (81 mg total) by mouth daily. Swallow whole. (Patient not taking: Reported on 10/29/2023) 30 tablet 11 Not Taking   atorvastatin  (LIPITOR ) 40 MG tablet Take 1 tablet (40 mg total) by mouth daily. (Patient not taking: Reported on 10/29/2023) 30 tablet 0 Not Taking   budesonide -formoterol  (SYMBICORT ) 160-4.5 MCG/ACT inhaler Inhale 2 puffs into the lungs 4 (four) times daily. (Patient not taking: Reported on 10/29/2023) 1 each 0 Not Taking   hydrocortisone -pramoxine (PROCTOFOAM-HC) rectal foam  Place 1 applicator rectally 2 (two) times daily. (Patient not taking: Reported on 10/29/2023) 10 g 1 Not Taking   lisinopril  (ZESTRIL ) 10 MG tablet Take 1 tablet (10 mg total) by mouth daily. (Patient not taking: Reported on 10/29/2023) 30 tablet 3 Not Taking   loperamide  (IMODIUM  A-D) 2 MG tablet Take 1 tablet (2 mg total) by mouth 4 (four) times daily as needed for diarrhea or loose stools. (Patient not taking: Reported on 10/29/2023) 30 tablet 0 Not Taking   nicotine  (NICODERM CQ  - DOSED IN MG/24 HOURS) 21 mg/24hr patch Place 1 patch (21 mg total) onto the skin daily. (Patient not taking: Reported on 10/29/2023) 28 patch 0 Not Taking   phenylephrine -shark liver oil-mineral oil-petrolatum (PREPARATION H) 0.25-14-74.9 % rectal ointment Place 1 Application rectally 2 (two) times daily as needed for hemorrhoids. (Patient not taking: Reported on 10/29/2023) 28 g 0 Not Taking    Current medications: Current Facility-Administered Medications  Medication Dose Route Frequency Provider Last Rate Last Admin   acetaminophen  (TYLENOL ) tablet 1,000 mg  1,000 mg Oral Q6H PRN Jesus America, NP   1,000 mg at 11/01/23 9057   Or   acetaminophen  (TYLENOL ) suppository 650 mg  650 mg Rectal Q6H PRN Jesus America, NP  ALPRAZolam  (XANAX ) tablet 0.5 mg  0.5 mg Oral TID PRN Von Bellis, MD   0.5 mg at 11/01/23 0949   amLODipine  (NORVASC ) tablet 10 mg  10 mg Oral Daily Hudson, Caralyn, PA-C   10 mg at 11/01/23 9056   aspirin  EC tablet 81 mg  81 mg Oral Daily Duncan, Hazel V, MD   81 mg at 11/01/23 9056   atorvastatin  (LIPITOR ) tablet 80 mg  80 mg Oral Daily Decoste, Dorene, PA-C   80 mg at 11/01/23 9056   bisacodyl  (DULCOLAX) suppository 10 mg  10 mg Rectal Daily PRN Von Bellis, MD       bisoprolol  (ZEBETA ) tablet 5 mg  5 mg Oral Daily Decoste, Gabriella, PA-C   5 mg at 11/01/23 9055   Chlorhexidine  Gluconate Cloth 2 % PADS 6 each  6 each Topical Daily Von Bellis, MD   6 each at 11/01/23 0957   docusate  sodium (COLACE) capsule 100 mg  100 mg Oral Daily Von Bellis, MD   100 mg at 10/31/23 9063   enoxaparin  (LOVENOX ) injection 30 mg  30 mg Subcutaneous Q24H Von Bellis, MD   30 mg at 11/01/23 0945   free water  500 mL  500 mL Oral Once Decoste, Gabriella, PA-C       guaiFENesin  (MUCINEX ) 12 hr tablet 600 mg  600 mg Oral BID Duncan, Hazel V, MD   600 mg at 11/01/23 9056   hydrALAZINE  (APRESOLINE ) injection 5 mg  5 mg Intravenous Q4H PRN Duncan, Hazel V, MD       hydrocortisone  (ANUSOL -HC) suppository 25 mg  25 mg Rectal BID Von Bellis, MD   25 mg at 11/01/23 9055   insulin  aspart (novoLOG ) injection 0-5 Units  0-5 Units Subcutaneous QHS Cleatus Delayne GAILS, MD       insulin  aspart (novoLOG ) injection 0-9 Units  0-9 Units Subcutaneous TID WC Duncan, Hazel V, MD   1 Units at 11/01/23 1339   ipratropium-albuterol  (DUONEB) 0.5-2.5 (3) MG/3ML nebulizer solution 3 mL  3 mL Nebulization Q6H PRN Von Bellis, MD       isosorbide  mononitrate (IMDUR ) 24 hr tablet 120 mg  120 mg Oral Daily Decoste, Gabriella, PA-C   120 mg at 11/01/23 0943   lidocaine  (LIDODERM ) 5 % 1 patch  1 patch Transdermal Q24H Jesus America, NP   1 patch at 10/31/23 2052   losartan  (COZAAR ) tablet 50 mg  50 mg Oral Daily Decoste, Gabriella, PA-C   50 mg at 11/01/23 0943   melatonin tablet 5 mg  5 mg Oral QHS Belue, Nathan S, RPH   5 mg at 10/31/23 2055   ondansetron  (ZOFRAN ) tablet 4 mg  4 mg Oral Q6H PRN Duncan, Hazel V, MD       Or   ondansetron  (ZOFRAN ) injection 4 mg  4 mg Intravenous Q6H PRN Duncan, Hazel V, MD   4 mg at 10/31/23 2054   oxyCODONE  (Oxy IR/ROXICODONE ) immediate release tablet 5 mg  5 mg Oral Q6H PRN Von Bellis, MD   5 mg at 11/01/23 1339   polyethylene glycol (MIRALAX  / GLYCOLAX ) packet 17 g  17 g Oral BID Von Bellis, MD   17 g at 10/31/23 2055   predniSONE  (DELTASONE ) tablet 40 mg  40 mg Oral Q breakfast Duncan, Hazel V, MD   40 mg at 11/01/23 9056      Allergies: Allergies  Allergen Reactions    Sulfa Antibiotics Nausea And Vomiting      Past Medical History: Past Medical  History:  Diagnosis Date   Anxiety    Asthma    Cardiomegaly    COPD (chronic obstructive pulmonary disease) (HCC)    GERD (gastroesophageal reflux disease)    Hypertension    Low back pain    Mitral valve insufficiency    Nicotine  dependence    Obesity      Past Surgical History: Past Surgical History:  Procedure Laterality Date   CESAREAN SECTION     COLONOSCOPY WITH PROPOFOL  N/A 07/18/2015   Procedure: COLONOSCOPY WITH PROPOFOL ;  Surgeon: Donnice Vaughn Manes, MD;  Location[facility] ENDOSCOPY;  Service: Endoscopy;  Laterality: N/A;   ESOPHAGOGASTRODUODENOSCOPY (EGD) WITH PROPOFOL  N/A 07/18/2015   Procedure: ESOPHAGOGASTRODUODENOSCOPY (EGD) WITH PROPOFOL ;  Surgeon: Donnice Vaughn Manes, MD;  Location: [facility] ENDOSCOPY;  Service: Endoscopy;  Laterality: N/A;   Essential Tubal Ligation     TONSILLECTOMY       Family History: History reviewed. No pertinent family history.   Social History: Social History   Socioeconomic History   Marital status: Widowed    Spouse name: Not on file   Number of children: Not on file   Years of education: Not on file   Highest education level: Not on file  Occupational History   Not on file  Tobacco Use   Smoking status: Every Day    Current packs/day: 1.00    Types: Cigarettes   Smokeless tobacco: Never  Substance and Sexual Activity   Alcohol use: No   Drug use: No   Sexual activity: Not on file  Other Topics Concern   Not on file  Social History Narrative   Not on file   Social Drivers of Health   Financial Resource Strain: Not on file  Food Insecurity: No Food Insecurity (10/29/2023)   Hunger Vital Sign    Worried About Running Out of Food in the Last Year: Never true    Ran Out of Food in the Last Year: Never true  Transportation Needs: No Transportation Needs (10/29/2023)   PRAPARE - Administrator, Civil Service (Medical): No    Lack  of Transportation (Non-Medical): No  Physical Activity: Not on file  Stress: Not on file  Social Connections: Unknown (10/29/2023)   Social Connection and Isolation Panel    Frequency of Communication with Friends and Family: More than three times a week    Frequency of Social Gatherings with Friends and Family: More than three times a week    Attends Religious Services: More than 4 times per year    Active Member of [employer] or Organizations: Patient declined    Attends Banker Meetings: Patient declined    Marital Status: Patient declined  Intimate Partner Violence: Not At Risk (10/29/2023)   Humiliation, Afraid, Rape, and Kick questionnaire    Fear of Current or Ex-Partner: No    Emotionally Abused: No    Physically Abused: No    Sexually Abused: No     Review of Systems: Review of Systems  Constitutional:  Negative for chills, fever and malaise/fatigue.  HENT:  Negative for congestion, sore throat and tinnitus.   Eyes:  Negative for blurred vision and redness.  Respiratory:  Positive for shortness of breath. Negative for cough and wheezing.   Cardiovascular:  Negative for chest pain, palpitations, claudication and leg swelling.  Gastrointestinal:  Negative for abdominal pain, blood in stool, diarrhea, nausea and vomiting.  Genitourinary:  Negative for flank pain, frequency and hematuria.  Musculoskeletal:  Negative for back pain, falls  and myalgias.  Skin:  Negative for rash.  Neurological:  Negative for dizziness, weakness and headaches.  Endo/Heme/Allergies:  Does not bruise/bleed easily.  Psychiatric/Behavioral:  Negative for depression. The patient is not nervous/anxious and does not have insomnia.     Vital Signs: Blood pressure (!) 106/56, pulse 71, temperature 97.7 F (36.5 C), temperature source Oral, resp. rate 20, height 5' (1.524 m), weight 51 kg, SpO2 94%.  Weight trends: Filed Weights   10/30/23 0500 10/31/23 0500 11/01/23 0451  Weight: 51 kg 51 kg  51 kg    Physical Exam: General: NAD  Head: Normocephalic, atraumatic. Moist oral mucosal membranes  Eyes: Anicteric  Neck: Supple  Lungs:  Clear to auscultation  Heart: Regular rate and rhythm  Abdomen:  Soft, nontender  Extremities:  No peripheral edema.  Neurologic: Alert and awake  Skin: No lesions        Lab results: Basic Metabolic Panel: Recent Labs  Lab 10/28/23 2309 10/29/23 1018 10/30/23 0529 10/31/23 0659 11/01/23 0609  NA 140   < > 139 140 141  K 3.8   < > 4.1 4.2 4.6  CL 109   < > 102 104 109  CO2 21*   < > 25 27 25   GLUCOSE 221*   < > 152* 98 121*  BUN 22   < > 36* 55* 61*  CREATININE 1.15*   < > 1.57* 1.82* 1.59*  CALCIUM  8.5*   < > 8.9 8.8* 8.8*  MG 3.8*  --  2.3 2.4 2.5*  PHOS  --    < > 4.6 5.2* 4.2   < > = values in this interval not displayed.    Liver Function Tests: Recent Labs  Lab 10/30/23 0529 10/31/23 0659 11/01/23 0609  AST 19 15 17   ALT 18 16 15   ALKPHOS 66 67 70  BILITOT 0.4 <0.2 0.7  PROT 6.3* 6.4* 6.5  ALBUMIN 3.1* 3.2* 3.0*   No results for input(s): LIPASE, AMYLASE in the last 168 hours. No results for input(s): AMMONIA in the last 168 hours.  CBC: Recent Labs  Lab 10/28/23 2309 10/29/23 1018 10/30/23 0529 10/31/23 0659 11/01/23 0609  WBC 7.7 8.2 15.8* 15.8* 11.5*  NEUTROABS 5.0  --   --   --   --   HGB 12.3 12.1 11.6* 12.5 12.4  HCT 38.6 38.5 35.9* 39.7 38.1  MCV 92.8 92.3 91.1 92.5 91.8  PLT 271 250 256 309 290    Cardiac Enzymes: No results for input(s): CKTOTAL, CKMB, CKMBINDEX, TROPONINI in the last 168 hours.  BNP: Invalid input(s): POCBNP  CBG: Recent Labs  Lab 10/31/23 1201 10/31/23 1701 10/31/23 2155 11/01/23 0829 11/01/23 1332  GLUCAP 82 129* 156* 114* 146*    Microbiology: Results for orders placed or performed during the hospital encounter of 10/03/20  Resp Panel by RT-PCR (Flu A&B, Covid) Nasopharyngeal Swab     Status: None   Collection Time: 10/03/20  2:36 PM    Specimen: Nasopharyngeal Swab; Nasopharyngeal(NP) swabs in vial transport medium  Result Value Ref Range Status   SARS Coronavirus 2 by RT PCR NEGATIVE NEGATIVE Final    Comment: (NOTE) SARS-CoV-2 target nucleic acids are NOT DETECTED.  The SARS-CoV-2 RNA is generally detectable in upper respiratory specimens during the acute phase of infection. The lowest concentration of SARS-CoV-2 viral copies this assay can detect is 138 copies/mL. A negative result does not preclude SARS-Cov-2 infection and should not be used as the sole basis for treatment or other patient  management decisions. A negative result may occur with  improper specimen collection/handling, submission of specimen other than nasopharyngeal swab, presence of viral mutation(s) within the areas targeted by this assay, and inadequate number of viral copies(<138 copies/mL). A negative result must be combined with clinical observations, patient history, and epidemiological information. The expected result is Negative.  Fact Sheet for Patients:  BloggerCourse.com  Fact Sheet for Healthcare Providers:  SeriousBroker.it  This test is no t yet approved or cleared by the [place]  FDA and  has been authorized for detection and/or diagnosis of SARS-CoV-2 by FDA under an Emergency Use Authorization (EUA). This EUA will remain  in effect (meaning this test can be used) for the duration of the COVID-19 declaration under Section 564(b)(1) of the Act, 21 U.S.C.section 360bbb-3(b)(1), unless the authorization is terminated  or revoked sooner.       Influenza A by PCR NEGATIVE NEGATIVE Final   Influenza B by PCR NEGATIVE NEGATIVE Final    Comment: (NOTE) The Xpert Xpress SARS-CoV-2/FLU/RSV plus assay is intended as an aid in the diagnosis of influenza from Nasopharyngeal swab specimens and should not be used as a sole basis for treatment. Nasal washings and aspirates are  unacceptable for Xpert Xpress SARS-CoV-2/FLU/RSV testing.  Fact Sheet for Patients: BloggerCourse.com  Fact Sheet for Healthcare Providers: SeriousBroker.it  This test is not yet approved or cleared by the [place]  FDA and has been authorized for detection and/or diagnosis of SARS-CoV-2 by FDA under an Emergency Use Authorization (EUA). This EUA will remain in effect (meaning this test can be used) for the duration of the COVID-19 declaration under Section 564(b)(1) of the Act, 21 U.S.C. section 360bbb-3(b)(1), unless the authorization is terminated or revoked.  Performed at [facility], [address] [place], [place] [address]   MRSA Next Gen by PCR, Nasal     Status: None   Collection Time: 10/03/20  3:23 PM   Specimen: Nasal Mucosa; Nasal Swab  Result Value Ref Range Status   MRSA by PCR Next Gen NOT DETECTED NOT DETECTED Final    Comment: (NOTE) The GeneXpert MRSA Assay (FDA approved for NASAL specimens only), is one component of a comprehensive MRSA colonization surveillance program. It is not intended to diagnose MRSA infection nor to guide or monitor treatment for MRSA infections. Test performance is not FDA approved in patients less than 21 years old. Performed at [facility], [address] [place], [place] [address]     Coagulation Studies: No results for input(s): LABPROT, INR in the last 72 hours.  Urinalysis: No results for input(s): COLORURINE, LABSPEC, PHURINE, GLUCOSEU, HGBUR, BILIRUBINUR, KETONESUR, PROTEINUR, UROBILINOGEN, NITRITE, LEUKOCYTESUR in the last 72 hours.  Invalid input(s): APPERANCEUR    Imaging: No results found.   Assessment & Plan: Ms. Avamarie Crossley is a 68 y.o.  female with past medical history including HTN, HLD, TIA, COPD, who was admitted to [facility] on 10/28/2023 for SOB (shortness of breath) [R06.02] Hypoxia [R09.02] COPD  exacerbation (HCC) [J44.1] Acute exacerbation of CHF (congestive heart failure) (HCC) [I50.9] Acute hypoxic respiratory failure (HCC) [J96.01]  Acute kidney injury on chronic kidney disease stage IIIa. Baseline creatinine appears to be 1.15 with GFR 53 on 8/11. Acute kidney injury likely secondary to aggressive diuresis.  Diuretics now held and renal function improving. Will order renal US  to rule out obstruction.   2. Chronic diastolic heart failure, ECHO on 10/29/23 shows EF 60-65% with severe LVH and grade 1 diastolic dysfunction.   3. .Anemia of  chronic kidney disease Lab Results  Component Value Date   HGB 12.4 11/01/2023    Hgb within optimal range.   4. Hypertension with chronic kidney disease. Home regimen amlodipine  and lisinopril .   LOS: 3 Amiri Riechers 8/15/20253:14 PM

## 2023-11-02 DIAGNOSIS — J9601 Acute respiratory failure with hypoxia: Secondary | ICD-10-CM | POA: Diagnosis not present

## 2023-11-02 LAB — CBC
HCT: 39.5 % (ref 36.0–46.0)
Hemoglobin: 12.4 g/dL (ref 12.0–15.0)
MCH: 29.5 pg (ref 26.0–34.0)
MCHC: 31.4 g/dL (ref 30.0–36.0)
MCV: 93.8 fL (ref 80.0–100.0)
Platelets: 263 K/uL (ref 150–400)
RBC: 4.21 MIL/uL (ref 3.87–5.11)
RDW: 13.6 % (ref 11.5–15.5)
WBC: 10 K/uL (ref 4.0–10.5)
nRBC: 0 % (ref 0.0–0.2)

## 2023-11-02 LAB — BASIC METABOLIC PANEL WITH GFR
Anion gap: 9 (ref 5–15)
BUN: 63 mg/dL — ABNORMAL HIGH (ref 8–23)
CO2: 22 mmol/L (ref 22–32)
Calcium: 8.5 mg/dL — ABNORMAL LOW (ref 8.9–10.3)
Chloride: 108 mmol/L (ref 98–111)
Creatinine, Ser: 1.43 mg/dL — ABNORMAL HIGH (ref 0.44–1.00)
GFR, Estimated: 40 mL/min — ABNORMAL LOW (ref >60)
Glucose, Bld: 96 mg/dL (ref 70–99)
Potassium: 4.7 mmol/L (ref 3.5–5.1)
Sodium: 139 mmol/L (ref 135–145)

## 2023-11-02 LAB — GLUCOSE, CAPILLARY
Glucose-Capillary: 108 mg/dL — ABNORMAL HIGH (ref 70–99)
Glucose-Capillary: 115 mg/dL — ABNORMAL HIGH (ref 70–99)

## 2023-11-02 MED ORDER — ACETAMINOPHEN 325 MG PO TABS
650.0000 mg | ORAL_TABLET | Freq: Three times a day (TID) | ORAL | Status: DC
  Administered 2023-11-02: 650 mg via ORAL
  Filled 2023-11-02: qty 2

## 2023-11-02 MED ORDER — HYDROMORPHONE HCL 1 MG/ML IJ SOLN
0.5000 mg | Freq: Once | INTRAMUSCULAR | Status: AC
  Administered 2023-11-02: 0.5 mg via INTRAVENOUS
  Filled 2023-11-02: qty 0.5

## 2023-11-02 MED ORDER — HYDROXYZINE HCL 25 MG PO TABS: 25.0000 mg | ORAL_TABLET | Freq: Three times a day (TID) | ORAL | 0 refills | Status: AC | PRN

## 2023-11-02 MED ORDER — FUROSEMIDE 20 MG PO TABS: 20.0000 mg | ORAL_TABLET | Freq: Every day | ORAL | Status: DC

## 2023-11-02 MED ORDER — BISOPROLOL FUMARATE 5 MG PO TABS: 5.0000 mg | ORAL_TABLET | Freq: Every day | ORAL | 11 refills | Status: AC

## 2023-11-02 MED ORDER — BISACODYL EC 5 MG PO TBEC: 10.0000 mg | DELAYED_RELEASE_TABLET | Freq: Every day | ORAL | 2 refills | Status: AC

## 2023-11-02 MED ORDER — ASPIRIN 81 MG PO TBEC: 81.0000 mg | DELAYED_RELEASE_TABLET | Freq: Every day | ORAL | 11 refills | Status: AC

## 2023-11-02 MED ORDER — PHENYLEPHRINE IN HARD FAT 0.25 % RE SUPP
1.0000 | Freq: Two times a day (BID) | RECTAL | Status: DC | PRN
  Filled 2023-11-02 (×3): qty 1

## 2023-11-02 MED ORDER — LOSARTAN POTASSIUM 50 MG PO TABS
50.0000 mg | ORAL_TABLET | Freq: Every day | ORAL | Status: DC
  Administered 2023-11-02: 50 mg via ORAL
  Filled 2023-11-02: qty 1

## 2023-11-02 MED ORDER — LIDOCAINE 5 % EX OINT: TOPICAL_OINTMENT | Freq: Two times a day (BID) | CUTANEOUS | 0 refills | Status: AC | PRN

## 2023-11-02 MED ORDER — POLYETHYLENE GLYCOL 3350 17 G PO PACK: 17.0000 g | PACK | Freq: Two times a day (BID) | ORAL | 2 refills | Status: AC

## 2023-11-02 MED ORDER — PREPARATION H 0.25-14-74.9 % RE OINT
1.0000 | TOPICAL_OINTMENT | Freq: Two times a day (BID) | RECTAL | 0 refills | Status: AC | PRN
Start: 2023-11-02 — End: ?

## 2023-11-02 MED ORDER — AMLODIPINE BESYLATE 10 MG PO TABS: 10.0000 mg | ORAL_TABLET | Freq: Every day | ORAL | 11 refills | Status: AC

## 2023-11-02 MED ORDER — PANTOPRAZOLE SODIUM 40 MG PO TBEC: 40.0000 mg | DELAYED_RELEASE_TABLET | Freq: Every day | ORAL | 11 refills | Status: AC

## 2023-11-02 MED ORDER — HYDROCORT-PRAMOXINE (PERIANAL) 1-1 % EX FOAM
1.0000 | Freq: Two times a day (BID) | CUTANEOUS | 1 refills | Status: AC
Start: 2023-11-02 — End: ?

## 2023-11-02 MED ORDER — LOSARTAN POTASSIUM 50 MG PO TABS: 100.0000 mg | ORAL_TABLET | Freq: Every day | ORAL | Status: DC

## 2023-11-02 MED ORDER — ATORVASTATIN CALCIUM 80 MG PO TABS: 80.0000 mg | ORAL_TABLET | Freq: Every day | ORAL | 5 refills | Status: AC

## 2023-11-02 MED ORDER — HYDROCORTISONE ACETATE 25 MG RE SUPP
25.0000 mg | Freq: Two times a day (BID) | RECTAL | 0 refills | Status: AC
Start: 2023-11-02 — End: ?

## 2023-11-02 MED ORDER — LOSARTAN POTASSIUM 50 MG PO TABS: 50.0000 mg | ORAL_TABLET | Freq: Every day | ORAL | 11 refills | Status: AC

## 2023-11-02 MED ORDER — ISOSORBIDE MONONITRATE ER 120 MG PO TB24: 120.0000 mg | ORAL_TABLET | Freq: Two times a day (BID) | ORAL | 11 refills | Status: AC

## 2023-11-02 MED ORDER — PANTOPRAZOLE SODIUM 40 MG IV SOLR
40.0000 mg | Freq: Two times a day (BID) | INTRAVENOUS | Status: DC
  Administered 2023-11-02: 40 mg via INTRAVENOUS
  Filled 2023-11-02: qty 10

## 2023-11-02 MED ORDER — FUROSEMIDE 20 MG PO TABS: 20.0000 mg | ORAL_TABLET | Freq: Every day | ORAL | 11 refills | Status: AC

## 2023-11-02 MED ORDER — LIDOCAINE 5 % EX OINT
TOPICAL_OINTMENT | Freq: Two times a day (BID) | CUTANEOUS | Status: DC | PRN
  Filled 2023-11-02: qty 35.44

## 2023-11-02 MED ORDER — HYDROCORTISONE ACETATE 25 MG RE SUPP
25.0000 mg | Freq: Two times a day (BID) | RECTAL | Status: DC
  Administered 2023-11-02: 25 mg via RECTAL
  Filled 2023-11-02: qty 1

## 2023-11-02 MED ORDER — ISOSORBIDE MONONITRATE ER 60 MG PO TB24
120.0000 mg | ORAL_TABLET | Freq: Two times a day (BID) | ORAL | Status: DC
  Administered 2023-11-02: 120 mg via ORAL
  Filled 2023-11-02: qty 2

## 2023-11-02 NOTE — Discharge Summary (Signed)
 [facility] Discharge Summary   Patient: Kimberly Fisher FMW:[id number]  PCP: Pcp, No  Date of admission: 10/28/2023   Date of discharge:  11/02/2023     Discharge Diagnoses:  Principal Problem:   Acute respiratory failure with hypoxia (HCC) Active Problems:   Acute exacerbation of CHF (congestive heart failure) (HCC)   COPD with acute exacerbation (HCC)   Hypertensive urgency   Hyperglycemia   Elevated troponin   Stage 3a chronic kidney disease (HCC)   History of TIA (transient ischemic attack)   Acute hypoxic respiratory failure (HCC)   Admitted From: Home Disposition:  Home   Recommendations for Outpatient Follow-up:  Follow-up with PCP in 1 week, continue to monitor BP at home and follow with PCP and cardiology to titrate medication accordingly. Follow-up with cardiology for ischemic workup as an outpatient. May need referral to general surgery for hemorrhoids if persistent severe pain. Follow up LABS/TEST:  Repeat CBC and BMP after 1 week   Follow-up Information     Alluri, Keller BROCKS, MD. Go in 3 week(s).   Specialty: Cardiology Contact information: [address] [place] [address] [phone]         Hudson, Caralyn, PA-C. Go in 1 week(s).   Specialty: Cardiology Why: [facility] on 11/05/2023 at [facility] information: [address] [place] [address] [phone]         PCP Follow up in 1 week(s).                 Diet recommendation: Cardiac and Carb modified diet  Activity: The patient is advised to gradually reintroduce usual activities, as tolerated  Discharge Condition: stable  Code Status: Full code   History of present illness: As per the H and P dictated on admission.  Hospital Course:  [name] is a 68 y.o. female with medical history significant for HTN, COPD, TIA 2022 in the setting of hypertensive urgency, being admitted with new onset CHF as well as possible COPD.   She arrived by EMS on CPAP after being called out for respiratory distress.  She denied chest pain, fever or chills.  She endorses orthopnea.  En route she received DuoNebs, and magnesium. In the ED, hypertensive to 231/119, tachycardic to 105, tachypneic to the mid 20s, transitioned to O2 at 6 L on arrival, maintaining sats in the mid 90s. Labs notable for troponin 50 and BNP 900 CBC WNL CMP notable for hyperglycemia of 221, Baseline creatinine of 1.15 EKG showing sinus tachycardia at 105 ST depression with T wave inversion inferolateral leads Chest x-ray with mild interstitial pulmonary edema and small left pleural effusion Patient treated with DuoNebs and Solu-Medrol  and placed on Nitropaste and given a dose of Lasix  Admission requested      Assessment and Plan:   # Acute respiratory failure with hypoxia. Patient arrived on CPAP by EMS.  O2 sat reportedly 32% on room air with EMS. Likely secondary to a combination of CHF and COPD S/p BiPAP, which has been weaned off Currently saturating well on room air.  Respiratory failure resolved     # COPD with acute exacerbation: S/p Scheduled and as needed nebulized bronchodilators S/p IV steroids, s/p prednisone  40 mg p.o. for 4 days Antitussives, flutter valve. Supplemental O2 as above Currently stable.   # Acute exacerbation of dCHF (congestive heart failure) Hypertensive urgency/emergency No prior history of CHF S/p Nitropaste for BP control and CHF management S/p IV Lasix  8/15, increased Amlodipine  10 mg DC'd  lisinopril  and started losartan  50 mg p.o. daily.  Started Imdur  60 mg p.o. daily TTE shows LVEF 65%, severe concentric LV hypertrophy and grade 1 diastolic dysfunction S/p IV Lasix  given for diuresis 8/13 stopped Lasix  for now due to elevated creatinine Patient is euvolemic currently. Cardiology consulted, recommended cardiac cath after renal function improvement, can be done as an outpatient. Patient was discharged on  bisoprolol  5 mg p.o. OD, Lasix  20 mg p.o. OD, losartan  50 mg daily amlodipine  10 mg p.o. OD, Imdur  120 mg daily as per cardiology.  Patient was advised to monitor BP at home and follow with PCP and cardio to titrate medications accordingly. Patient was cleared by cardiology to discharge home and follow-up as an outpatient.     # AKI most likely due to diuresis # CKD stage IIIa. Baseline creatinine 1.15 with GFR 53 on 8/11  8/14 creatinine 1.82 elevated 8/16 sCr 1. 43 slightly improved, resumed low-dose Lasix  20 mg p.o. daily on discharge.  Nephrology was consulted due to elevated creatinine but this started improving after holding diuresis.  Repeat BMP after 1 week and follow with PCP.   # Elevated troponin Troponin 50--> 79, without chest pain or ischemic EKG findings Suspect demand ischemia TTE as above  Cardiology consulted, recommend cardiac cath after infection improvement. Started Imdur  and increased dose to 120 mg pod   # Hyperglycemia Random blood glucose 221. No prior history of diabetes HbA1c 5.4 within normal range S/p insulin  sliding scale during hospital stay, no meal needed.   # History of TIA (transient ischemic attack) No acute issues Continue aspirin  and statin   # Urinary retention, Foley catheter was inserted on 8/12 and 450 mL urine was collected.  Foley catheter was removed before discharge and patient is voiding well.  # Constipation, started laxatives   # Hemorrhoids: Started hemorrhoidal suppositories Patient was advised to change position every 12 hourly and ambulate.  Avoid straining.  Use laxatives. Patient was discharged on hydrocortisone  25 mg suppository twice daily, continued hydrocortisone -pramoxine rectal foam.  Lidocaine  5% ointment as needed, Preparation H rectal ointment renewed. Patient was advised to follow-up with PCP and general surgery as an outpatient.  Body mass index is 21.1 kg/m.  Nutrition Interventions:  - Patient was instructed, not  to drive, operate heavy machinery, perform activities at heights, swimming or participation in water  activities or provide baby sitting services while on Pain, Sleep and Anxiety Medications; until her outpatient Physician has advised to do so again.  - Also recommended to not to take more than prescribed Pain, Sleep and Anxiety Medications.  Patient was ambulatory without any assistance. On the day of the discharge the patient's vitals were stable, and no other acute medical condition were reported by patient. the patient was felt safe to be discharge at Home.  Consultants: Cardiology and nephrology Procedures: None  Discharge Exam: General: Appear in no distress, no Rash; Oral Mucosa Clear, moist. Cardiovascular: S1 and S2 Present, no Murmur, Respiratory: normal respiratory effort, Bilateral Air entry present and no Crackles, no wheezes Abdomen: Bowel Sound present, Soft and no tenderness, no hernia Extremities: no Pedal edema, no calf tenderness Neurology: alert and oriented to time, place, and person affect appropriate.  Filed Weights   10/31/23 0500 11/01/23 0451 11/02/23 0500  Weight: 51 kg 51 kg 49 kg   Vitals:   11/02/23 0948 11/02/23 1140  BP:  (!) 98/56  Pulse:  67  Resp:  17  Temp:  97.9 F (36.6 C)  SpO2: 100%  95%    DISCHARGE MEDICATION: Allergies as of 11/02/2023       Reactions   Sulfa Antibiotics Nausea And Vomiting        Medication List     STOP taking these medications    lisinopril  10 MG tablet Commonly known as: ZESTRIL    loperamide  2 MG tablet Commonly known as: IMODIUM  A-D       TAKE these medications    albuterol  108 (90 Base) MCG/ACT inhaler Commonly known as: VENTOLIN  HFA Inhale 1-2 puffs into the lungs every 4 (four) hours as needed for wheezing or shortness of breath.   amLODipine  10 MG tablet Commonly known as: NORVASC  Take 1 tablet (10 mg total) by mouth daily. What changed:  medication strength how much to take   aspirin   EC 81 MG tablet Take 1 tablet (81 mg total) by mouth daily. Swallow whole.   atorvastatin  80 MG tablet Commonly known as: Lipitor  Take 1 tablet (80 mg total) by mouth daily. What changed:  medication strength how much to take   bisacodyl  5 MG EC tablet Generic drug: bisacodyl  Take 2 tablets (10 mg total) by mouth at bedtime. Transition to as needed if no constipation   bisoprolol  5 MG tablet Commonly known as: ZEBETA  Take 1 tablet (5 mg total) by mouth daily. Start taking on: November 03, 2023   budesonide -formoterol  160-4.5 MCG/ACT inhaler Commonly known as: SYMBICORT  Inhale 2 puffs into the lungs 4 (four) times daily.   furosemide  20 MG tablet Commonly known as: LASIX  Take 1 tablet (20 mg total) by mouth daily. Start taking on: November 03, 2023   hydrocortisone  25 MG suppository Commonly known as: ANUSOL -HC Place 1 suppository (25 mg total) rectally 2 (two) times daily.   hydrocortisone -pramoxine rectal foam Commonly known as: PROCTOFOAM-HC Place 1 applicator rectally 2 (two) times daily.   hydrOXYzine  25 MG tablet Commonly known as: ATARAX  Take 1 tablet (25 mg total) by mouth 3 (three) times daily as needed for anxiety.   isosorbide  mononitrate 120 MG 24 hr tablet Commonly known as: IMDUR  Take 1 tablet (120 mg total) by mouth 2 (two) times daily.   lidocaine  5 % ointment Commonly known as: XYLOCAINE  Apply topically 2 (two) times daily as needed (Apply to rectal area for hemorrhoids).   losartan  50 MG tablet Commonly known as: COZAAR  Take 1 tablet (50 mg total) by mouth daily. Start taking on: November 03, 2023   nicotine  21 mg/24hr patch Commonly known as: NICODERM CQ  - dosed in mg/24 hours Place 1 patch (21 mg total) onto the skin daily.   pantoprazole  40 MG tablet Commonly known as: Protonix  Take 1 tablet (40 mg total) by mouth daily.   polyethylene glycol 17 g packet Commonly known as: MIRALAX  / GLYCOLAX  Take 17 g by mouth 2 (two) times daily.  Transition to as needed if no constipation   Preparation H 0.25-14-74.9 % rectal ointment Generic drug: phenylephrine -shark liver oil-mineral oil-petrolatum Place 1 Application rectally 2 (two) times daily as needed for hemorrhoids.       Allergies  Allergen Reactions   Sulfa Antibiotics Nausea And Vomiting   Discharge Instructions     Diet - low sodium heart healthy   Complete by: As directed    Discharge instructions   Complete by: As directed    Follow-up with PCP in 1 week, continue to monitor BP at home and follow with PCP and cardiology to titrate medication accordingly. Follow-up with cardiology for ischemic workup as an outpatient. Repeat CBC and  BMP after 1 week May need referral to general surgery for hemorrhoids if persistent severe pain.   Increase activity slowly   Complete by: As directed        The results of significant diagnostics from this hospitalization (including imaging, microbiology, ancillary and laboratory) are listed below for reference.    Significant Diagnostic Studies: US  RENAL Result Date: 11/01/2023 CLINICAL DATA:  Acute kidney failure. EXAM: RENAL / URINARY TRACT ULTRASOUND COMPLETE COMPARISON:  Abdomen and pelvis CT dated 11/11/2022 FINDINGS: Right Kidney: Renal measurements: 8.5 x 4.2 x 4.1 cm = volume: 76 mL. Echogenicity within normal limits. Simple appearing cysts, not requiring imaging follow-up. No solid masses visualized. No mass or hydronephrosis visualized. Thin partial rim of minimal simple appearing fluid adjacent to the kidney. Left Kidney: Renal measurements: 8.8 x 4.5 x 4.1 cm = volume: 84 mL. Echogenicity within normal limits. Simple appearing cysts, not requiring imaging follow-up. No solid masses visualized. No mass or hydronephrosis visualized. Bladder: Appears normal for degree of bladder distention. Other: None. IMPRESSION: 1. No acute abnormality. 2. Thin partial rim of minimal simple appearing fluid adjacent to the right kidney.  This is of doubtful clinical significance. Electronically Signed   By: Elspeth Bathe M.D.   On: 11/01/2023 16:30   ECHOCARDIOGRAM COMPLETE Result Date: 10/29/2023    ECHOCARDIOGRAM REPORT   Patient Name:   CARRISSA TAITANO Date of Exam: 10/29/2023 Medical Rec #:  [id number]      Height:       60.0 in Accession #:    [id number]     Weight:       113.1 lb Date of Birth:  1955-03-26     BSA:          1.465 m Patient Age:    67 years       BP:           117/95 mmHg Patient Gender: F              HR:           105 bpm. Exam Location:  [facility] Procedure: 2D Echo, Cardiac Doppler and Color Doppler (Both Spectral and Color            Flow Doppler were utilized during procedure). Indications:     CHF  History:         Patient has no prior history of Echocardiogram examinations.                  COPD and TIA; Risk Factors:Hypertension.  Sonographer:     Thea Norlander Referring Phys:  [id number] DELAYNE LULLA SOLIAN Diagnosing Phys: Marsa Dooms MD IMPRESSIONS  1. Left ventricular ejection fraction, by estimation, is 60 to 65%. The left ventricle has normal function. The left ventricle has no regional wall motion abnormalities. There is severe concentric left ventricular hypertrophy. Left ventricular diastolic  parameters are consistent with Grade I diastolic dysfunction (impaired relaxation).  2. Right ventricular systolic function is normal. The right ventricular size is normal.  3. The mitral valve is normal in structure. Mild to moderate mitral valve regurgitation. No evidence of mitral stenosis.  4. The aortic valve is normal in structure. Aortic valve regurgitation is mild. No aortic stenosis is present.  5. The inferior vena cava is normal in size with greater than 50% respiratory variability, suggesting right atrial pressure of 3 mmHg. FINDINGS  Left Ventricle: Left ventricular ejection fraction, by estimation, is 60 to 65%. The left ventricle has normal function. The left  ventricle has no regional wall motion  abnormalities. Strain was performed and the global longitudinal strain is indeterminate. The left ventricular internal cavity size was small. There is severe concentric left ventricular hypertrophy. Left ventricular diastolic parameters are consistent with Grade I diastolic dysfunction (impaired relaxation). Right Ventricle: The right ventricular size is normal. No increase in right ventricular wall thickness. Right ventricular systolic function is normal. Left Atrium: Left atrial size was normal in size. Right Atrium: Right atrial size was normal in size. Pericardium: There is no evidence of pericardial effusion. Mitral Valve: The mitral valve is normal in structure. Mild to moderate mitral valve regurgitation. No evidence of mitral valve stenosis. Tricuspid Valve: The tricuspid valve is normal in structure. Tricuspid valve regurgitation is not demonstrated. No evidence of tricuspid stenosis. Aortic Valve: The aortic valve is normal in structure. Aortic valve regurgitation is mild. No aortic stenosis is present. Aortic valve peak gradient measures 9.6 mmHg. Pulmonic Valve: The pulmonic valve was normal in structure. Pulmonic valve regurgitation is not visualized. No evidence of pulmonic stenosis. Aorta: The aortic root is normal in size and structure. Venous: The inferior vena cava is normal in size with greater than 50% respiratory variability, suggesting right atrial pressure of 3 mmHg. IAS/Shunts: No atrial level shunt detected by color flow Doppler. Additional Comments: 3D was performed not requiring image post processing on an independent workstation and was indeterminate.  LEFT VENTRICLE PLAX 2D LVIDd:         3.50 cm   Diastology LVIDs:         2.40 cm   LV e' medial:    4.03 cm/s LV PW:         1.80 cm   LV E/e' medial:  14.7 LV IVS:        1.60 cm   LV e' lateral:   5.22 cm/s LVOT diam:     2.00 cm   LV E/e' lateral: 11.3 LV SV:         48 LV SV Index:   33 LVOT Area:     3.14 cm  RIGHT VENTRICLE              IVC RV S prime:     23.10 cm/s  IVC diam: 0.80 cm TAPSE (M-mode): 1.8 cm LEFT ATRIUM             Index        RIGHT ATRIUM          Index LA diam:        4.50 cm 3.07 cm/m   RA Area:     9.50 cm LA Vol (A2C):   55.0 ml 37.54 ml/m  RA Volume:   17.50 ml 11.95 ml/m LA Vol (A4C):   57.8 ml 39.45 ml/m LA Biplane Vol: 61.3 ml 41.84 ml/m  AORTIC VALVE AV Area (Vmax): 2.35 cm AV Vmax:        155.00 cm/s AV Peak Grad:   9.6 mmHg LVOT Vmax:      116.00 cm/s LVOT Vmean:     70.700 cm/s LVOT VTI:       0.153 m  AORTA Ao Root diam: 3.50 cm Ao Asc diam:  3.40 cm MITRAL VALVE                TRICUSPID VALVE MV Area (PHT): 5.97 cm     TR Peak grad:   5.3 mmHg MV Decel Time: 127 msec     TR Vmax:  115.00 cm/s MR Peak grad: 141.8 mmHg MR Vmax:      595.33 cm/s   SHUNTS MV E velocity: 59.20 cm/s   Systemic VTI:  0.15 m MV A velocity: 103.00 cm/s  Systemic Diam: 2.00 cm MV E/A ratio:  0.57 Marsa Dooms MD Electronically signed by Marsa Dooms MD Signature Date/Time: 10/29/2023/12:22:52 PM    Final    DG Chest Portable 1 View Result Date: 10/29/2023 CLINICAL DATA:  COPD exacerbation EXAM: PORTABLE CHEST 1 VIEW COMPARISON:  [age] teen 23 FINDINGS: Lung volumes are small. Superimposed mild diffuse interstitial pulmonary infiltrate is present, new since prior examination most in keeping with mild interstitial pulmonary edema. No pneumothorax. Small left pleural effusion. Cardiac size within normal limits. No acute bone IMPRESSION: 1. Mild interstitial pulmonary edema. 2. Small left pleural effusion. Electronically Signed   By: Dorethia Molt M.D.   On: 10/29/2023 00:57    Microbiology: No results found for this or any previous visit (from the past 240 hours).   Labs: CBC: Recent Labs  Lab 10/28/23 2309 10/29/23 1018 10/30/23 0529 10/31/23 0659 11/01/23 0609 11/02/23 0525  WBC 7.7 8.2 15.8* 15.8* 11.5* 10.0  NEUTROABS 5.0  --   --   --   --   --   HGB 12.3 12.1 11.6* 12.5 12.4 12.4  HCT  38.6 38.5 35.9* 39.7 38.1 39.5  MCV 92.8 92.3 91.1 92.5 91.8 93.8  PLT 271 250 256 309 290 263   Basic Metabolic Panel: Recent Labs  Lab 10/28/23 2309 10/29/23 1018 10/30/23 0529 10/31/23 0659 11/01/23 0609 11/02/23 0525  NA 140 140 139 140 141 139  K 3.8 3.5 4.1 4.2 4.6 4.7  CL 109 103 102 104 109 108  CO2 21* 21* 25 27 25 22   GLUCOSE 221* 228* 152* 98 121* 96  BUN 22 22 36* 55* 61* 63*  CREATININE 1.15* 1.23* 1.57* 1.82* 1.59* 1.43*  CALCIUM  8.5* 8.8* 8.9 8.8* 8.8* 8.5*  MG 3.8*  --  2.3 2.4 2.5*  --   PHOS  --  2.6 4.6 5.2* 4.2  --    Liver Function Tests: Recent Labs  Lab 10/28/23 2309 10/30/23 0529 10/31/23 0659 11/01/23 0609  AST 45* 19 15 17   ALT 27 18 16 15   ALKPHOS 78 66 67 70  BILITOT 0.4 0.4 <0.2 0.7  PROT 6.5 6.3* 6.4* 6.5  ALBUMIN 3.2* 3.1* 3.2* 3.0*   No results for input(s): LIPASE, AMYLASE in the last 168 hours. No results for input(s): AMMONIA in the last 168 hours. Cardiac Enzymes: No results for input(s): CKTOTAL, CKMB, CKMBINDEX, TROPONINI in the last 168 hours. BNP (last 3 results) Recent Labs    10/28/23 2309  BNP 900.3*   CBG: Recent Labs  Lab 11/01/23 1332 11/01/23 1631 11/01/23 2114 11/02/23 0811 11/02/23 1139  GLUCAP 146* 168* 196* 108* 115*    Time spent: 35 minutes  Signed:  Elvan Sor  [facility] 11/02/2023 1:30 PM

## 2023-11-02 NOTE — Progress Notes (Addendum)
 [facility] CLINIC CARDIOLOGY PROGRESS NOTE       Patient ID: Kimberly Fisher MRN: [id number] DOB/AGE: [birth year] 68 y.o.  Admit date: 10/28/2023 Referring Physician Dr. Cleatus Primary Physician Pcp, No Primary Cardiologist None Reason for Consultation acute CHF  HPI: Kimberly Fisher is a 68 y.o. female  with a past medical history of hypertension, hyperlipidemia, history of TIA in setting of hypertensive urgency (2022), COPD, tobacco use who presented to the ED on 10/28/2023 for worsening shortness of breath, abdominal bloating for the past week.  Patient also endorses chest pressure that radiates to her left arm and back that started yesterday that has been constant.  Patient denies any history of CAD, MI, HF or AF.  Patient states her mom died from a heart attack when she was 68 years old.  Cardiology was consulted for further evaluation.   Interval History: -Patient seen and examined this AM and sitting upright on side of bed. States she feels ok this AM. Reports rectal pain. CP overall improved, worse with movement/palpation.  -Pt appears euvolemic. Cr improved on AM labs.  -Patients BP elevated and HR stable this AM.   Review of systems complete and found to be negative unless listed above    Past Medical History:  Diagnosis Date   Anxiety    Asthma    Cardiomegaly    COPD (chronic obstructive pulmonary disease) (HCC)    GERD (gastroesophageal reflux disease)    Hypertension    Low back pain    Mitral valve insufficiency    Nicotine  dependence    Obesity     Past Surgical History:  Procedure Laterality Date   CESAREAN SECTION     COLONOSCOPY WITH PROPOFOL  N/A 07/18/2015   Procedure: COLONOSCOPY WITH PROPOFOL ;  Surgeon: Donnice Vaughn Manes, MD;  Location: [facility] ENDOSCOPY;  Service: Endoscopy;  Laterality: N/A;   ESOPHAGOGASTRODUODENOSCOPY (EGD) WITH PROPOFOL  N/A 07/18/2015   Procedure: ESOPHAGOGASTRODUODENOSCOPY (EGD) WITH PROPOFOL ;  Surgeon: Donnice Vaughn Manes, MD;  Location:  [facility] ENDOSCOPY;  Service: Endoscopy;  Laterality: N/A;   Essential Tubal Ligation     TONSILLECTOMY      Medications Prior to Admission  Medication Sig Dispense Refill Last Dose/Taking   albuterol  (VENTOLIN  HFA) 108 (90 Base) MCG/ACT inhaler Inhale 1-2 puffs into the lungs every 4 (four) hours as needed for wheezing or shortness of breath. 1 each 0 Taking As Needed   amLODipine  (NORVASC ) 5 MG tablet Take 1 tablet (5 mg total) by mouth daily. (Patient not taking: Reported on 10/29/2023) 30 tablet 2 Not Taking   aspirin  EC 81 MG EC tablet Take 1 tablet (81 mg total) by mouth daily. Swallow whole. (Patient not taking: Reported on 10/29/2023) 30 tablet 11 Not Taking   atorvastatin  (LIPITOR ) 40 MG tablet Take 1 tablet (40 mg total) by mouth daily. (Patient not taking: Reported on 10/29/2023) 30 tablet 0 Not Taking   budesonide -formoterol  (SYMBICORT ) 160-4.5 MCG/ACT inhaler Inhale 2 puffs into the lungs 4 (four) times daily. (Patient not taking: Reported on 10/29/2023) 1 each 0 Not Taking   hydrocortisone -pramoxine (PROCTOFOAM-HC) rectal foam Place 1 applicator rectally 2 (two) times daily. (Patient not taking: Reported on 10/29/2023) 10 g 1 Not Taking   lisinopril  (ZESTRIL ) 10 MG tablet Take 1 tablet (10 mg total) by mouth daily. (Patient not taking: Reported on 10/29/2023) 30 tablet 3 Not Taking   loperamide  (IMODIUM  A-D) 2 MG tablet Take 1 tablet (2 mg total) by mouth 4 (four) times daily as needed for diarrhea or loose stools. (Patient not  taking: Reported on 10/29/2023) 30 tablet 0 Not Taking   nicotine  (NICODERM CQ  - DOSED IN MG/24 HOURS) 21 mg/24hr patch Place 1 patch (21 mg total) onto the skin daily. (Patient not taking: Reported on 10/29/2023) 28 patch 0 Not Taking   phenylephrine -shark liver oil-mineral oil-petrolatum (PREPARATION H) 0.25-14-74.9 % rectal ointment Place 1 Application rectally 2 (two) times daily as needed for hemorrhoids. (Patient not taking: Reported on 10/29/2023) 28 g 0 Not Taking    Social History   Socioeconomic History   Marital status: Widowed    Spouse name: Not on file   Number of children: Not on file   Years of education: Not on file   Highest education level: Not on file  Occupational History   Not on file  Tobacco Use   Smoking status: Every Day    Current packs/day: 1.00    Types: Cigarettes   Smokeless tobacco: Never  Substance and Sexual Activity   Alcohol use: No   Drug use: No   Sexual activity: Not on file  Other Topics Concern   Not on file  Social History Narrative   Not on file   Social Drivers of Health   Financial Resource Strain: Not on file  Food Insecurity: No Food Insecurity (10/29/2023)   Hunger Vital Sign    Worried About Running Out of Food in the Last Year: Never true    Ran Out of Food in the Last Year: Never true  Transportation Needs: No Transportation Needs (10/29/2023)   PRAPARE - Administrator, Civil Service (Medical): No    Lack of Transportation (Non-Medical): No  Physical Activity: Not on file  Stress: Not on file  Social Connections: Unknown (10/29/2023)   Social Connection and Isolation Panel    Frequency of Communication with Friends and Family: More than three times a week    Frequency of Social Gatherings with Friends and Family: More than three times a week    Attends Religious Services: More than 4 times per year    Active Member of [employer] or Organizations: Patient declined    Attends Banker Meetings: Patient declined    Marital Status: Patient declined  Intimate Partner Violence: Not At Risk (10/29/2023)   Humiliation, Afraid, Rape, and Kick questionnaire    Fear of Current or Ex-Partner: No    Emotionally Abused: No    Physically Abused: No    Sexually Abused: No    History reviewed. No pertinent family history.   Vitals:   11/02/23 0349 11/02/23 0500 11/02/23 0651 11/02/23 0806  BP: (!) 179/98  (!) 184/93 (!) 150/97  Pulse: (!) 55  (!) 55 60  Resp: 17   18  Temp:  97.7 F (36.5 C)   97.6 F (36.4 C)  TempSrc: Oral   Oral  SpO2: 97%  95% 97%  Weight:  49 kg    Height:        PHYSICAL EXAM General: Chronically ill-appearing elderly female, well nourished, in no acute distress. HEENT: Normocephalic and atraumatic. Neck: No JVD.   Lungs: Normal respiratory effort on room air.  CTAB Heart: HRR. Normal S1 and S2 without gallops or murmurs.  Abdomen: Non-distended appearing.  Msk: Normal strength and tone for age. Extremities: Warm and well perfused. No clubbing, cyanosis, edema.  Neuro: Alert and oriented X 3. Psych: Answers questions appropriately.   Labs: Basic Metabolic Panel: Recent Labs    10/31/23 0659 11/01/23 0609 11/02/23 0525  NA 140 141 139  K 4.2 4.6 4.7  CL 104 109 108  CO2 27 25 22   GLUCOSE 98 121* 96  BUN 55* 61* 63*  CREATININE 1.82* 1.59* 1.43*  CALCIUM  8.8* 8.8* 8.5*  MG 2.4 2.5*  --   PHOS 5.2* 4.2  --    Liver Function Tests: Recent Labs    10/31/23 0659 11/01/23 0609  AST 15 17  ALT 16 15  ALKPHOS 67 70  BILITOT <0.2 0.7  PROT 6.4* 6.5  ALBUMIN 3.2* 3.0*   No results for input(s): LIPASE, AMYLASE in the last 72 hours. CBC: Recent Labs    11/01/23 0609 11/02/23 0525  WBC 11.5* 10.0  HGB 12.4 12.4  HCT 38.1 39.5  MCV 91.8 93.8  PLT 290 263   Cardiac Enzymes: No results for input(s): CKTOTAL, CKMB, CKMBINDEX, TROPONINIHS in the last 72 hours.  BNP: No results for input(s): BNP in the last 72 hours.  D-Dimer: No results for input(s): DDIMER in the last 72 hours. Hemoglobin A1C: No results for input(s): HGBA1C in the last 72 hours.  Fasting Lipid Panel: No results for input(s): CHOL, HDL, LDLCALC, TRIG, CHOLHDL, LDLDIRECT in the last 72 hours.  Thyroid  Function Tests: No results for input(s): TSH, T4TOTAL, T3FREE, THYROIDAB in the last 72 hours.  Invalid input(s): FREET3 Anemia Panel: No results for input(s): VITAMINB12, FOLATE, FERRITIN,  TIBC, IRON, RETICCTPCT in the last 72 hours.   Radiology: US  RENAL Result Date: 11/01/2023 CLINICAL DATA:  Acute kidney failure. EXAM: RENAL / URINARY TRACT ULTRASOUND COMPLETE COMPARISON:  Abdomen and pelvis CT dated 11/11/2022 FINDINGS: Right Kidney: Renal measurements: 8.5 x 4.2 x 4.1 cm = volume: 76 mL. Echogenicity within normal limits. Simple appearing cysts, not requiring imaging follow-up. No solid masses visualized. No mass or hydronephrosis visualized. Thin partial rim of minimal simple appearing fluid adjacent to the kidney. Left Kidney: Renal measurements: 8.8 x 4.5 x 4.1 cm = volume: 84 mL. Echogenicity within normal limits. Simple appearing cysts, not requiring imaging follow-up. No solid masses visualized. No mass or hydronephrosis visualized. Bladder: Appears normal for degree of bladder distention. Other: None. IMPRESSION: 1. No acute abnormality. 2. Thin partial rim of minimal simple appearing fluid adjacent to the right kidney. This is of doubtful clinical significance. Electronically Signed   By: Elspeth Bathe M.D.   On: 11/01/2023 16:30   ECHOCARDIOGRAM COMPLETE Result Date: 10/29/2023    ECHOCARDIOGRAM REPORT   Patient Name:   SHAHRZAD KOBLE Date of Exam: 10/29/2023 Medical Rec #:  [id number]      Height:       60.0 in Accession #:    [id number]     Weight:       113.1 lb Date of Birth:  02-27-1956     BSA:          1.465 m Patient Age:    67 years       BP:           117/95 mmHg Patient Gender: F              HR:           105 bpm. Exam Location:  [facility] Procedure: 2D Echo, Cardiac Doppler and Color Doppler (Both Spectral and Color            Flow Doppler were utilized during procedure). Indications:     CHF  History:         Patient has no prior history of Echocardiogram examinations.  COPD and TIA; Risk Factors:Hypertension.  Sonographer:     Thea Norlander Referring Phys:  [id number] DELAYNE LULLA SOLIAN Diagnosing Phys: Marsa Dooms MD IMPRESSIONS  1. Left  ventricular ejection fraction, by estimation, is 60 to 65%. The left ventricle has normal function. The left ventricle has no regional wall motion abnormalities. There is severe concentric left ventricular hypertrophy. Left ventricular diastolic  parameters are consistent with Grade I diastolic dysfunction (impaired relaxation).  2. Right ventricular systolic function is normal. The right ventricular size is normal.  3. The mitral valve is normal in structure. Mild to moderate mitral valve regurgitation. No evidence of mitral stenosis.  4. The aortic valve is normal in structure. Aortic valve regurgitation is mild. No aortic stenosis is present.  5. The inferior vena cava is normal in size with greater than 50% respiratory variability, suggesting right atrial pressure of 3 mmHg. FINDINGS  Left Ventricle: Left ventricular ejection fraction, by estimation, is 60 to 65%. The left ventricle has normal function. The left ventricle has no regional wall motion abnormalities. Strain was performed and the global longitudinal strain is indeterminate. The left ventricular internal cavity size was small. There is severe concentric left ventricular hypertrophy. Left ventricular diastolic parameters are consistent with Grade I diastolic dysfunction (impaired relaxation). Right Ventricle: The right ventricular size is normal. No increase in right ventricular wall thickness. Right ventricular systolic function is normal. Left Atrium: Left atrial size was normal in size. Right Atrium: Right atrial size was normal in size. Pericardium: There is no evidence of pericardial effusion. Mitral Valve: The mitral valve is normal in structure. Mild to moderate mitral valve regurgitation. No evidence of mitral valve stenosis. Tricuspid Valve: The tricuspid valve is normal in structure. Tricuspid valve regurgitation is not demonstrated. No evidence of tricuspid stenosis. Aortic Valve: The aortic valve is normal in structure. Aortic valve  regurgitation is mild. No aortic stenosis is present. Aortic valve peak gradient measures 9.6 mmHg. Pulmonic Valve: The pulmonic valve was normal in structure. Pulmonic valve regurgitation is not visualized. No evidence of pulmonic stenosis. Aorta: The aortic root is normal in size and structure. Venous: The inferior vena cava is normal in size with greater than 50% respiratory variability, suggesting right atrial pressure of 3 mmHg. IAS/Shunts: No atrial level shunt detected by color flow Doppler. Additional Comments: 3D was performed not requiring image post processing on an independent workstation and was indeterminate.  LEFT VENTRICLE PLAX 2D LVIDd:         3.50 cm   Diastology LVIDs:         2.40 cm   LV e' medial:    4.03 cm/s LV PW:         1.80 cm   LV E/e' medial:  14.7 LV IVS:        1.60 cm   LV e' lateral:   5.22 cm/s LVOT diam:     2.00 cm   LV E/e' lateral: 11.3 LV SV:         48 LV SV Index:   33 LVOT Area:     3.14 cm  RIGHT VENTRICLE             IVC RV S prime:     23.10 cm/s  IVC diam: 0.80 cm TAPSE (M-mode): 1.8 cm LEFT ATRIUM             Index        RIGHT ATRIUM          Index LA diam:  4.50 cm 3.07 cm/m   RA Area:     9.50 cm LA Vol (A2C):   55.0 ml 37.54 ml/m  RA Volume:   17.50 ml 11.95 ml/m LA Vol (A4C):   57.8 ml 39.45 ml/m LA Biplane Vol: 61.3 ml 41.84 ml/m  AORTIC VALVE AV Area (Vmax): 2.35 cm AV Vmax:        155.00 cm/s AV Peak Grad:   9.6 mmHg LVOT Vmax:      116.00 cm/s LVOT Vmean:     70.700 cm/s LVOT VTI:       0.153 m  AORTA Ao Root diam: 3.50 cm Ao Asc diam:  3.40 cm MITRAL VALVE                TRICUSPID VALVE MV Area (PHT): 5.97 cm     TR Peak grad:   5.3 mmHg MV Decel Time: 127 msec     TR Vmax:        115.00 cm/s MR Peak grad: 141.8 mmHg MR Vmax:      595.33 cm/s   SHUNTS MV E velocity: 59.20 cm/s   Systemic VTI:  0.15 m MV A velocity: 103.00 cm/s  Systemic Diam: 2.00 cm MV E/A ratio:  0.57 Marsa Dooms MD Electronically signed by Marsa Dooms MD  Signature Date/Time: 10/29/2023/12:22:52 PM    Final    DG Chest Portable 1 View Result Date: 10/29/2023 CLINICAL DATA:  COPD exacerbation EXAM: PORTABLE CHEST 1 VIEW COMPARISON:  S[age] teen 23 FINDINGS: Lung volumes are small. Superimposed mild diffuse interstitial pulmonary infiltrate is present, new since prior examination most in keeping with mild interstitial pulmonary edema. No pneumothorax. Small left pleural effusion. Cardiac size within normal limits. No acute bone IMPRESSION: 1. Mild interstitial pulmonary edema. 2. Small left pleural effusion. Electronically Signed   By: Dorethia Molt M.D.   On: 10/29/2023 00:57    ECHO as above  TELEMETRY reviewed by me 11/02/2023: Sinus rhythm, rate 50s  EKG reviewed by me: Sinus tachycardia, rate 105 bpm with nonspecific ST-T wave changes.  Data reviewed by me 11/02/2023: last 24h vitals tele labs imaging I/O hospitalist progress notes.  Principal Problem:   Acute respiratory failure with hypoxia (HCC) Active Problems:   Hypertensive urgency   Acute exacerbation of CHF (congestive heart failure) (HCC)   Stage 3a chronic kidney disease (HCC)   History of TIA (transient ischemic attack)   COPD with acute exacerbation (HCC)   Hyperglycemia   Elevated troponin   Acute hypoxic respiratory failure (HCC)    ASSESSMENT AND PLAN:  Kimberly Fisher is a 68 y.o. female  with a past medical history of hypertension, hyperlipidemia, history of TIA in setting of hypertensive urgency (2022), COPD, tobacco use who presented to the ED on 10/28/2023 for worsening shortness of breath, abdominal bloating for the past week.  Patient also endorses chest pressure that radiates to her left arm and back that started yesterday that has been constant.  Patient denies any history of CAD, MI, HF or AF.  Patient states her mom died from a heart attack when she was 68 years old.  Cardiology was consulted for further evaluation.   # Acute HFpEF # Acute respiratory failure  with hypoxia # AKI Patient with no known hx of HF. Presents with SOB, abdominal bloating. BNP elevated at 900.  CXR with small left pleural effusion and pulmonary vascular congestion. SOB has improved s/p IV lasix  40 mg x2. Echo revealed pEF (60-65%), no RWMA, severe concentric LVH, grade  I diastolic dysfunction, mild to mod MR. LA normalized. Patient appears euvolemic. Renal function improving. - Monitor and replenish electrolytes for a goal K >4, Mag >2  - Start lasix  20 mg daily tomorrow.  - Will consider addition of dapagliflozin outpatient pending renal function. (Copay $12.15). Losartan , bisoprolol  as stated below. - Consider cMRI in future to further evaluate infiltrative disease. Patient denies hx of biceps tendinitis or carpal tunnel syndrome. -Nephrology consulted by primary team.  # Intermittent chest pressure # Hypertension # Hyperlipidemia # Demand ischemia, setting of AoCHFpEF, respiratory distress Patient with hx of noncompliance to medications, have not take BP meds for years. EKG with sinus tachycardia, rate 105 bpm with nonspecific ST-T wave changes.  Troponins mildly elevated and flat 50 > 79 > 100 > 123 > 125. Lipid panel with elevated LDL 138, chol 218. Chest pressure much improved. -Cancelled LHC due to worsening renal function, risk of contrast induced nephropathy. This can be done outpatient when renal function normalizes. - Continue aspirin  81 mg and atorvastatin  to 80 mg daily. - Continue amlodipine  to 10 mg daily, losartan  50 mg daily and bisoprolol  5 mg daily. - Continue bisoprolol  5 mg daily.  - Increase Imdur  to 120 mg twice daily.   # COPD exacerbation # Tobacco use On 2L O2 with stable SpO2, no O2 requirement at home.  - Management per primary team.  Cardiology will sign off. Please haiku with questions or re-engage if needed. Appointment scheduled for 8/19 at our clinic - information in Provider Navigator.   This patient's plan of care was discussed and  created with Dr. Florencio and he is in agreement.  Signed: Danita Bloch, PA-C  11/02/2023, 8:40 AM [facility]

## 2023-11-02 NOTE — Progress Notes (Signed)
 Pt discharged to home via wheelchair with daughter. PIV removed. Discharge instructions reviewed with patient and daughter, all questions and concerns answered.

## 2023-11-02 NOTE — Progress Notes (Signed)
 [facility]  PROGRESS NOTE   Subjective:   Patient seen at bedside.  Feels much better.  Renal sonogram was negative.  Objective:  Vital signs: Blood pressure (!) 98/56, pulse 67, temperature 97.9 F (36.6 C), temperature source Oral, resp. rate 17, height 5' (1.524 m), weight 49 kg, SpO2 95%.  Intake/Output Summary (Last 24 hours) at 11/02/2023 1750 Last data filed at 11/02/2023 1300 Gross per 24 hour  Intake 480 ml  Output --  Net 480 ml   Filed Weights   10/31/23 0500 11/01/23 0451 11/02/23 0500  Weight: 51 kg 51 kg 49 kg     Physical Exam: General:  No acute distress  Head:  Normocephalic, atraumatic. Moist oral mucosal membranes  Eyes:  Anicteric  Neck:  Supple  Lungs:   Clear to auscultation, normal effort  Heart:  S1S2 no rubs  Abdomen:   Soft, nontender, bowel sounds present  Extremities:  peripheral edema.  Neurologic:  Awake, alert, following commands  Skin:  No lesions  Access:     Basic Metabolic Panel: Recent Labs  Lab 10/28/23 2309 10/29/23 1018 10/30/23 0529 10/31/23 0659 11/01/23 0609 11/02/23 0525  NA 140 140 139 140 141 139  K 3.8 3.5 4.1 4.2 4.6 4.7  CL 109 103 102 104 109 108  CO2 21* 21* 25 27 25 22   GLUCOSE 221* 228* 152* 98 121* 96  BUN 22 22 36* 55* 61* 63*  CREATININE 1.15* 1.23* 1.57* 1.82* 1.59* 1.43*  CALCIUM  8.5* 8.8* 8.9 8.8* 8.8* 8.5*  MG 3.8*  --  2.3 2.4 2.5*  --   PHOS  --  2.6 4.6 5.2* 4.2  --    GFR: Estimated Creatinine Clearance: 27.8 mL/min (A) (by C-G formula based on SCr of 1.43 mg/dL (H)).  Liver Function Tests: Recent Labs  Lab 10/28/23 2309 10/30/23 0529 10/31/23 0659 11/01/23 0609  AST 45* 19 15 17   ALT 27 18 16 15   ALKPHOS 78 66 67 70  BILITOT 0.4 0.4 <0.2 0.7  PROT 6.5 6.3* 6.4* 6.5  ALBUMIN 3.2* 3.1* 3.2* 3.0*   No results for input(s): LIPASE, AMYLASE in the last 168 hours. No results for input(s): AMMONIA in the last 168 hours.  CBC: Recent Labs  Lab 10/28/23 2309  10/29/23 1018 10/30/23 0529 10/31/23 0659 11/01/23 0609 11/02/23 0525  WBC 7.7 8.2 15.8* 15.8* 11.5* 10.0  NEUTROABS 5.0  --   --   --   --   --   HGB 12.3 12.1 11.6* 12.5 12.4 12.4  HCT 38.6 38.5 35.9* 39.7 38.1 39.5  MCV 92.8 92.3 91.1 92.5 91.8 93.8  PLT 271 250 256 309 290 263     HbA1C: Hgb A1c MFr Bld  Date/Time Value Ref Range Status  10/29/2023 05:37 PM 5.4 4.8 - 5.6 % Final    Comment:    (NOTE)         Prediabetes: 5.7 - 6.4         Diabetes: >6.4         Glycemic control for adults with diabetes: <7.0   10/04/2020 04:21 AM 5.8 (H) 4.8 - 5.6 % Final    Comment:    (NOTE) Pre diabetes:          5.7%-6.4%  Diabetes:              >6.4%  Glycemic control for   <7.0% adults with diabetes     Urinalysis: No results for input(s): COLORURINE, LABSPEC, PHURINE, GLUCOSEU, HGBUR,  BILIRUBINUR, KETONESUR, PROTEINUR, UROBILINOGEN, NITRITE, LEUKOCYTESUR in the last 72 hours.  Invalid input(s): APPERANCEUR    Imaging: US  RENAL Result Date: 11/01/2023 CLINICAL DATA:  Acute kidney failure. EXAM: RENAL / URINARY TRACT ULTRASOUND COMPLETE COMPARISON:  Abdomen and pelvis CT dated 11/11/2022 FINDINGS: Right Kidney: Renal measurements: 8.5 x 4.2 x 4.1 cm = volume: 76 mL. Echogenicity within normal limits. Simple appearing cysts, not requiring imaging follow-up. No solid masses visualized. No mass or hydronephrosis visualized. Thin partial rim of minimal simple appearing fluid adjacent to the kidney. Left Kidney: Renal measurements: 8.8 x 4.5 x 4.1 cm = volume: 84 mL. Echogenicity within normal limits. Simple appearing cysts, not requiring imaging follow-up. No solid masses visualized. No mass or hydronephrosis visualized. Bladder: Appears normal for degree of bladder distention. Other: None. IMPRESSION: 1. No acute abnormality. 2. Thin partial rim of minimal simple appearing fluid adjacent to the right kidney. This is of doubtful clinical significance.  Electronically Signed   By: Elspeth Bathe M.D.   On: 11/01/2023 16:30     Medications:     acetaminophen   650 mg Oral TID   amLODipine   10 mg Oral Daily   aspirin  EC  81 mg Oral Daily   atorvastatin   80 mg Oral Daily   bisoprolol   5 mg Oral Daily   Chlorhexidine  Gluconate Cloth  6 each Topical Daily   docusate sodium   100 mg Oral Daily   enoxaparin  (LOVENOX ) injection  30 mg Subcutaneous Q24H   free water   500 mL Oral Once   [START ON 11/03/2023] furosemide   20 mg Oral Daily   guaiFENesin   600 mg Oral BID   hydrocortisone   25 mg Rectal BID   insulin  aspart  0-5 Units Subcutaneous QHS   insulin  aspart  0-9 Units Subcutaneous TID WC   isosorbide  mononitrate  120 mg Oral BID   lidocaine   1 patch Transdermal Q24H   losartan   50 mg Oral Daily   melatonin  5 mg Oral QHS   pantoprazole  (PROTONIX ) IV  40 mg Intravenous Q12H   polyethylene glycol  17 g Oral BID    Assessment/ Plan:     68 year old female with history of hypertension, TIA, COPD, admitted with history of shortness of breath and dyspnea on exertion.  She was admitted for COPD exacerbation versus congestive heart failure.  #1: Acute kidney injury: Patient had acute kidney injury most likely secondary to diuresis.  Renal indices are stable.  Renal sonogram is negative for any obstruction.  #2: Anemia: Anemia is most likely secondary to chronic kidney disease versus iron deficiency.  #3: Congestive heart failure: Patient is advised to stay on fluid restriction and continue furosemide .  #4: Hypertension: Continue bisoprolol  and losartan .  Patient is advised on importance of 2 g salt restricted diet.  Labs and medications reviewed. Will continue to follow along with you.   LOS: 4 Rain Wilhide, MD [facility] 8/16/20255:50 PM
